# Patient Record
Sex: Male | Born: 1970
Health system: Southern US, Community
[De-identification: ages and names within clinical notes are randomized; demographics above are authoritative.]

## PROBLEM LIST (undated history)

## (undated) DIAGNOSIS — F32A Depression, unspecified: Secondary | ICD-10-CM

## (undated) DIAGNOSIS — F1010 Alcohol abuse, uncomplicated: Secondary | ICD-10-CM

## (undated) DIAGNOSIS — B019 Varicella without complication: Secondary | ICD-10-CM

## (undated) DIAGNOSIS — F329 Major depressive disorder, single episode, unspecified: Secondary | ICD-10-CM

## (undated) DIAGNOSIS — K219 Gastro-esophageal reflux disease without esophagitis: Secondary | ICD-10-CM

## (undated) HISTORY — DX: Depression, unspecified: F32.A

## (undated) HISTORY — DX: Gastro-esophageal reflux disease without esophagitis: K21.9

## (undated) HISTORY — DX: Major depressive disorder, single episode, unspecified: F32.9

## (undated) HISTORY — DX: Varicella without complication: B01.9

## (undated) HISTORY — PX: TONSILLECTOMY: SUR1361

## (undated) HISTORY — DX: Alcohol abuse, uncomplicated: F10.10

---

## 2013-06-19 DIAGNOSIS — T881XXA Other complications following immunization, not elsewhere classified, initial encounter: Secondary | ICD-10-CM | POA: Insufficient documentation

## 2013-06-20 DIAGNOSIS — F410 Panic disorder [episodic paroxysmal anxiety] without agoraphobia: Secondary | ICD-10-CM | POA: Insufficient documentation

## 2013-06-20 DIAGNOSIS — F329 Major depressive disorder, single episode, unspecified: Secondary | ICD-10-CM | POA: Insufficient documentation

## 2016-11-15 ENCOUNTER — Encounter (INDEPENDENT_AMBULATORY_CARE_PROVIDER_SITE_OTHER): Payer: Self-pay

## 2016-11-15 ENCOUNTER — Encounter: Payer: Self-pay | Admitting: Primary Care

## 2016-11-15 ENCOUNTER — Ambulatory Visit (INDEPENDENT_AMBULATORY_CARE_PROVIDER_SITE_OTHER): Payer: 59 | Admitting: Primary Care

## 2016-11-15 VITALS — BP 118/82 | HR 75 | Temp 98.3°F | Ht 71.25 in | Wt 179.8 lb

## 2016-11-15 DIAGNOSIS — K219 Gastro-esophageal reflux disease without esophagitis: Secondary | ICD-10-CM | POA: Insufficient documentation

## 2016-11-15 DIAGNOSIS — F419 Anxiety disorder, unspecified: Secondary | ICD-10-CM | POA: Diagnosis not present

## 2016-11-15 DIAGNOSIS — F329 Major depressive disorder, single episode, unspecified: Secondary | ICD-10-CM | POA: Insufficient documentation

## 2016-11-15 MED ORDER — FLUOXETINE HCL 40 MG PO CAPS: 40.0000 mg | ORAL_CAPSULE | Freq: Every day | ORAL | 2 refills | Status: DC

## 2016-11-15 NOTE — Patient Instructions (Signed)
I sent refills of the fluoxetine (Prozac) to your pharmacy.  Please sent me a copy of your recent labs.   It was a pleasure to meet you today! Please don't hesitate to call me with any questions. Welcome to [facility]!

## 2016-11-15 NOTE — Assessment & Plan Note (Signed)
Infrequent symptoms. Not on medication OTC or RX

## 2016-11-15 NOTE — Assessment & Plan Note (Signed)
Managed on Prozac, feels well managed. Refills sent to pharmacy. Denies SI/HI.

## 2016-11-15 NOTE — Progress Notes (Signed)
Subjective:    Patient ID: Bobby Diaz, male    DOB: Sep 24, 1970, 46 y.o.   MRN: [id number]  HPI  [name] is a 46 year old male who presents today to establish care and discuss the problems mentioned below. Will obtain old records. He follows annually for CPE's through his occupation.   1) PTSD/Anxiety/Depression: Diagnosed several years ago. Currently managed on fluoxetine 40 mg. Previously managed on Lexapro without improvement in symptoms. Feels well managed on Prozac. Denies SI/HI. Is needing refills today.  2) Hyperlipidemia: Noted on labs from [facility] Everywhere with TC of 227, LDL 160. Endorses normal lipid panel one week ago that was collected at [facility] through annual physical.     Review of Systems  Respiratory: Negative for shortness of breath.   Cardiovascular: Negative for chest pain.  Gastrointestinal:       Genella RifeGerd, infrequent symptoms  Skin: Negative for color change.  Allergic/Immunologic: Negative for environmental allergies.  Psychiatric/Behavioral: Negative for suicidal ideas.       Feels well managed on Prozac       Past Medical History:  Diagnosis Date  . Alcohol abuse   . Chickenpox   . Depression   . GERD (gastroesophageal reflux disease)      Social History   Social History  . Marital status: Married    Spouse name: N/A  . Number of children: N/A  . Years of education: N/A   Occupational History  . Not on file.   Social History Main Topics  . Smoking status: Current Every Day Smoker    Packs/day: 1.00  . Smokeless tobacco: Never Used  . Alcohol use No  . Drug use: Unknown  . Sexual activity: Not on file   Other Topics Concern  . Not on file   Social History Narrative   Married.   4 children.   Works in [employer].   Enjoys fishing, being outdoors, golfing.    No past surgical history on file.  Family History  Problem Relation Age of Onset  . Depression Mother   . Mental illness Mother   . Cancer Father   . Alcohol  abuse Brother   . Depression Brother   . Drug abuse Brother   . Mental illness Brother   . Cancer Maternal Grandmother        liver  . Depression Maternal Grandmother   . Alcohol abuse Maternal Grandfather   . Depression Maternal Grandfather   . Heart disease Maternal Grandfather   . Hyperlipidemia Maternal Grandfather   . Hypertension Maternal Grandfather   . Heart disease Paternal Grandmother   . Hyperlipidemia Paternal Grandmother   . Hyperlipidemia Paternal Grandfather     Allergies  Allergen Reactions  . Influenza Vaccines     Other reaction(s): Other Guillain-Barre  . Tetanus Toxoids Other (See Comments)    Can only have the synthetic shot    No current outpatient prescriptions on file prior to visit.   No current facility-administered medications on file prior to visit.     BP 118/82   Pulse 75   Temp 98.3 F (36.8 C) (Oral)   Ht 5' 11.25" (1.81 m)   Wt 179 lb 12.8 oz (81.6 kg)   SpO2 97%   BMI 24.90 kg/m    Objective:   Physical Exam  Constitutional: He is oriented to person, place, and time. He appears well-nourished.  Neck: Neck supple.  Cardiovascular: Normal rate and regular rhythm.   Pulmonary/Chest: Effort normal and breath  sounds normal. He has no wheezes. He has no rales.  Neurological: He is alert and oriented to person, place, and time.  Skin: Skin is warm and dry.  Psychiatric: He has a normal mood and affect.          Assessment & Plan:

## 2017-09-23 ENCOUNTER — Other Ambulatory Visit: Payer: Self-pay | Admitting: Primary Care

## 2017-09-23 DIAGNOSIS — F419 Anxiety disorder, unspecified: Principal | ICD-10-CM

## 2017-09-23 DIAGNOSIS — F329 Major depressive disorder, single episode, unspecified: Secondary | ICD-10-CM

## 2017-09-23 DIAGNOSIS — F32A Depression, unspecified: Secondary | ICD-10-CM

## 2017-09-25 ENCOUNTER — Emergency Department
Admission: EM | Admit: 2017-09-25 | Discharge: 2017-09-25 | Disposition: A | Discharge status: Home / Self Care | Payer: 59 | Attending: Emergency Medicine | Admitting: Emergency Medicine

## 2017-09-25 ENCOUNTER — Other Ambulatory Visit: Payer: Self-pay

## 2017-09-25 DIAGNOSIS — Y929 Unspecified place or not applicable: Secondary | ICD-10-CM | POA: Diagnosis not present

## 2017-09-25 DIAGNOSIS — S025XXA Fracture of tooth (traumatic), initial encounter for closed fracture: Secondary | ICD-10-CM

## 2017-09-25 DIAGNOSIS — X58XXXA Exposure to other specified factors, initial encounter: Secondary | ICD-10-CM | POA: Insufficient documentation

## 2017-09-25 DIAGNOSIS — Y939 Activity, unspecified: Secondary | ICD-10-CM | POA: Diagnosis not present

## 2017-09-25 DIAGNOSIS — K047 Periapical abscess without sinus: Secondary | ICD-10-CM | POA: Diagnosis not present

## 2017-09-25 DIAGNOSIS — Y999 Unspecified external cause status: Secondary | ICD-10-CM | POA: Diagnosis not present

## 2017-09-25 DIAGNOSIS — K0381 Cracked tooth: Secondary | ICD-10-CM | POA: Diagnosis not present

## 2017-09-25 DIAGNOSIS — F17210 Nicotine dependence, cigarettes, uncomplicated: Secondary | ICD-10-CM | POA: Diagnosis not present

## 2017-09-25 DIAGNOSIS — Z79899 Other long term (current) drug therapy: Secondary | ICD-10-CM | POA: Insufficient documentation

## 2017-09-25 MED ORDER — AMOXICILLIN 500 MG PO CAPS
500.0000 mg | ORAL_CAPSULE | Freq: Once | ORAL | Status: AC
  Administered 2017-09-25: 500 mg via ORAL
  Filled 2017-09-25: qty 1

## 2017-09-25 MED ORDER — IBUPROFEN 800 MG PO TABS
800.0000 mg | ORAL_TABLET | Freq: Once | ORAL | Status: AC
  Administered 2017-09-25: 800 mg via ORAL
  Filled 2017-09-25: qty 1

## 2017-09-25 MED ORDER — IBUPROFEN 800 MG PO TABS: 800.0000 mg | ORAL_TABLET | Freq: Three times a day (TID) | ORAL | 0 refills | Status: AC | PRN

## 2017-09-25 MED ORDER — OXYCODONE-ACETAMINOPHEN 5-325 MG PO TABS
1.0000 | ORAL_TABLET | Freq: Once | ORAL | Status: AC
  Administered 2017-09-25: 1 via ORAL
  Filled 2017-09-25: qty 1

## 2017-09-25 MED ORDER — AMOXICILLIN 500 MG PO CAPS: 500.0000 mg | ORAL_CAPSULE | Freq: Three times a day (TID) | ORAL | 0 refills | Status: DC

## 2017-09-25 MED ORDER — OXYCODONE-ACETAMINOPHEN 5-325 MG PO TABS: 1.0000 | ORAL_TABLET | ORAL | 0 refills | Status: DC | PRN

## 2017-09-25 MED ORDER — LIDOCAINE VISCOUS HCL 2 % MT SOLN
15.0000 mL | Freq: Once | OROMUCOSAL | Status: AC
  Administered 2017-09-25: 15 mL via OROMUCOSAL
  Filled 2017-09-25: qty 15

## 2017-09-25 NOTE — Discharge Instructions (Addendum)
1. Take antibiotic as prescribed (Amoxicillin 500mg three times daily x 7 days). °2. Take pain medicines as needed (Motrin/Percocet #15). °3. Return to the ER for worsening symptoms, persistent vomiting, fever, difficulty breathing or other concerns. ° °

## 2017-09-25 NOTE — ED Provider Notes (Signed)
[facility] Emergency Department Provider Note   ____________________________________________   First MD Initiated Contact with Patient 09/25/17 [phone]     (approximate)  I have reviewed the triage vital signs and the nursing notes.   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  Patient recently moved from [place] and does not yet have a dentist in the area.  States he broke his left lower tooth several days ago.  Now complains of pain and swelling.  Denies associated fever, chills, chest pain, shortness of breath, abdominal pain, nausea or vomiting.  Denies recent trauma.   Past Medical History:  Diagnosis Date  . Alcohol abuse   . Chickenpox   . Depression   . GERD (gastroesophageal reflux disease)     Patient Active Problem List   Diagnosis Date Noted  . Anxiety and depression 11/15/2016  . GERD (gastroesophageal reflux disease) 11/15/2016      Prior to Admission medications   Medication Sig Start Date End Date Taking? Authorizing Provider  amoxicillin (AMOXIL) 500 MG capsule Take 1 capsule (500 mg total) by mouth 3 (three) times daily. 09/25/17   Irean Hong, MD  FLUoxetine (PROZAC) 40 MG capsule Take 1 capsule (40 mg total) by mouth daily. NEED APPOINTMENT FOR ANY MORE REFILLS 09/23/17   Doreene Nest, NP  ibuprofen (ADVIL,MOTRIN) 800 MG tablet Take 1 tablet (800 mg total) by mouth every 8 (eight) hours as needed for moderate pain. 09/25/17   Irean Hong, MD  oxyCODONE-acetaminophen (PERCOCET/ROXICET) 5-325 MG tablet Take 1 tablet by mouth every 4 (four) hours as needed for severe pain. 09/25/17   Irean Hong, MD    Allergies Influenza vaccines and Tetanus toxoids  Family History  Problem Relation Age of Onset  . Depression Mother   . Mental illness Mother   . Cancer Father   . Alcohol abuse Brother   .  Depression Brother   . Drug abuse Brother   . Mental illness Brother   . Cancer Maternal Grandmother        liver  . Depression Maternal Grandmother   . Alcohol abuse Maternal Grandfather   . Depression Maternal Grandfather   . Heart disease Maternal Grandfather   . Hyperlipidemia Maternal Grandfather   . Hypertension Maternal Grandfather   . Heart disease Paternal Grandmother   . Hyperlipidemia Paternal Grandmother   . Hyperlipidemia Paternal Grandfather     Social History Social History   Tobacco Use  . Smoking status: Current Every Day Smoker    Packs/day: 1.00  . Smokeless tobacco: Never Used  Substance Use Topics  . Alcohol use: No  . Drug use: Not on file    Review of Systems  Constitutional: No fever/chills Eyes: No visual changes. ENT: Positive for dentalgia.  No sore throat. Cardiovascular: Denies chest pain. Respiratory: Denies shortness of breath. Gastrointestinal: No abdominal pain.  No nausea, no vomiting.  No diarrhea.  No constipation. Genitourinary: Negative for dysuria. Musculoskeletal: Negative for back pain. Skin: Negative for rash. Neurological: Negative for headaches, focal weakness or numbness.   ____________________________________________   PHYSICAL EXAM:  VITAL SIGNS: ED Triage Vitals  Enc Vitals Group     BP 09/25/17 0104 (!) 161/88     Pulse Rate 09/25/17 0104 68     Resp 09/25/17 0104 20     Temp  09/25/17 0104 97.6 F (36.4 C)     Temp Source 09/25/17 0104 Oral     SpO2 09/25/17 0104 99 %     Weight 09/25/17 0105 185 lb (83.9 kg)     Height 09/25/17 0105 6' (1.829 m)     Head Circumference --      Peak Flow --      Pain Score 09/25/17 0104 8     Pain Loc --      Pain Edu? --      Excl. in GC? --     Constitutional: Alert and oriented. Well appearing and in no acute distress. Eyes: Conjunctivae are normal. PERRL. EOMI. Head: Atraumatic. Nose: No congestion/rhinnorhea. Mouth/Throat: Mucous membranes are moist.  Left  lower molar cracked.  No intra-or extraoral swelling.  Tender to palpation with tongue blade. Neck: No stridor.  No neck swelling. Hematological/Lymphatic/Immunilogical: No cervical lymphadenopathy. Cardiovascular: Normal rate, regular rhythm. Grossly normal heart sounds.  Good peripheral circulation. Respiratory: Normal respiratory effort.  No retractions. Lungs CTAB. Gastrointestinal: Soft and nontender. No distention. No abdominal bruits. No CVA tenderness. Musculoskeletal: No lower extremity tenderness nor edema.  No joint effusions. Neurologic:  Normal speech and language. No gross focal neurologic deficits are appreciated. No gait instability. Skin:  Skin is warm, dry and intact. No rash noted. Psychiatric: Mood and affect are normal. Speech and behavior are normal.  ____________________________________________   LABS (all labs ordered are listed, but only abnormal results are displayed)  Labs Reviewed - No data to display ____________________________________________  EKG  None ____________________________________________  RADIOLOGY  ED MD interpretation: None  Official radiology report(s): No results found.  ____________________________________________   PROCEDURES  Procedure(s) performed: None  Procedures  Critical Care performed: No  ____________________________________________   INITIAL IMPRESSION / ASSESSMENT AND PLAN / ED COURSE  As part of my medical decision making, I reviewed the following data within the electronic MEDICAL RECORD NUMBER Nursing notes reviewed and incorporated, Old chart reviewed and Notes from prior ED visits   47 year old male who presents with dentalgia secondary to recent broken tooth.  Will start patient on amoxicillin, Motrin and Percocet for pain, and refer to local dentist.  Lidocaine rinse in the ED.  Strict return precautions given.  Patient verbalizes understanding and agrees with plan of care.       ____________________________________________   FINAL CLINICAL IMPRESSION(S) / ED DIAGNOSES  Final diagnoses:  Closed fracture of tooth, initial encounter  Dental infection     ED Discharge Orders        Ordered    amoxicillin (AMOXIL) 500 MG capsule  3 times daily     09/25/17 0250    ibuprofen (ADVIL,MOTRIN) 800 MG tablet  Every 8 hours PRN     09/25/17 0250    oxyCODONE-acetaminophen (PERCOCET/ROXICET) 5-325 MG tablet  Every 4 hours PRN     09/25/17 0250       Note:  This document was prepared using Dragon voice recognition software and may include unintentional dictation errors.    Irean HongSung, Jade J, MD 09/25/17 [id number]

## 2017-09-25 NOTE — ED Triage Notes (Signed)
Reports broke a tooth recently.  Reports pain to left lower jaw.

## 2017-09-25 NOTE — ED Notes (Signed)
Pt to the er for a infection in the mouth. Pt broke tooth on the lower left side. Now pain and swelling present. Pt recently moved from [place] and does not have a Education officer, communitydentist.

## 2017-10-22 ENCOUNTER — Other Ambulatory Visit: Payer: Self-pay | Admitting: Primary Care

## 2017-10-22 DIAGNOSIS — F329 Major depressive disorder, single episode, unspecified: Secondary | ICD-10-CM

## 2017-10-22 DIAGNOSIS — F419 Anxiety disorder, unspecified: Principal | ICD-10-CM

## 2017-10-22 DIAGNOSIS — F32A Depression, unspecified: Secondary | ICD-10-CM

## 2017-12-09 ENCOUNTER — Other Ambulatory Visit: Payer: Self-pay | Admitting: Primary Care

## 2017-12-09 DIAGNOSIS — F329 Major depressive disorder, single episode, unspecified: Secondary | ICD-10-CM

## 2017-12-09 DIAGNOSIS — F419 Anxiety disorder, unspecified: Principal | ICD-10-CM

## 2017-12-13 ENCOUNTER — Telehealth: Payer: Self-pay | Admitting: Primary Care

## 2017-12-13 DIAGNOSIS — F329 Major depressive disorder, single episode, unspecified: Secondary | ICD-10-CM

## 2017-12-13 DIAGNOSIS — F419 Anxiety disorder, unspecified: Principal | ICD-10-CM

## 2017-12-13 MED ORDER — FLUOXETINE HCL 40 MG PO CAPS: 40.0000 mg | ORAL_CAPSULE | Freq: Every day | ORAL | 0 refills | Status: DC

## 2017-12-13 NOTE — Telephone Encounter (Signed)
Fluoxetine refill Last Refill:10/24/17 # 30 Last OV: 11/15/16 PCP: Mayra Reel NP [facility] [address] Pt has upcoming appt 12/19/17. Will refill until appt.

## 2017-12-13 NOTE — Telephone Encounter (Signed)
Copied from CRM [id number]. Topic: Quick Communication - Rx Refill/Question >> Dec 13, 2017 10:47 AM Mcneil, Ja-Kwan wrote: Medication: FLUoxetine (PROZAC) 40 MG capsule  Has the patient contacted their pharmacy? no  Preferred Pharmacy (with phone number or street name): [id number] [place], [place] - [address] [phone] (Phone) [phone] (Fax)  Agent: Please be advised that RX refills may take up to 3 business days. We ask that you follow-up with your pharmacy.

## 2017-12-19 ENCOUNTER — Ambulatory Visit: Payer: 59 | Admitting: Primary Care

## 2017-12-29 ENCOUNTER — Ambulatory Visit (INDEPENDENT_AMBULATORY_CARE_PROVIDER_SITE_OTHER): Payer: 59 | Admitting: Primary Care

## 2017-12-29 ENCOUNTER — Encounter: Payer: Self-pay | Admitting: Primary Care

## 2017-12-29 DIAGNOSIS — F419 Anxiety disorder, unspecified: Secondary | ICD-10-CM | POA: Diagnosis not present

## 2017-12-29 DIAGNOSIS — F329 Major depressive disorder, single episode, unspecified: Secondary | ICD-10-CM

## 2017-12-29 MED ORDER — FLUOXETINE HCL 40 MG PO CAPS: 40.0000 mg | ORAL_CAPSULE | Freq: Every day | ORAL | 3 refills | Status: DC

## 2017-12-29 MED ORDER — FLUOXETINE HCL 20 MG PO CAPS: 20.0000 mg | ORAL_CAPSULE | Freq: Every day | ORAL | 0 refills | Status: DC

## 2017-12-29 NOTE — Progress Notes (Signed)
Subjective:    Patient ID: Bobby Diaz, male    DOB: 13-Jan-1971, 47 y.o.   MRN: [id number]  HPI  Mr. Bobby Diaz is a 47 year old male who presents today for medication refill.   He is currently managed on fluoxetine 40 mg for anxiety and depression. He was last evaluated one year ago as a new patient, get's his annual CPE's from his employer.   He is due for a refill today. 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 His wife has noticed a decrease in his overall patience over the last 6-8 months. He has noticed that he's more irritable, he will "shut down", feels anxious, worries. She has been under a lot of stress with home and work life. Has decreased libido over the last 6 months, has been under a lot of stress at home.  GAD 7 score of 10 today. He was once on Lexapro in the past which made him feel "woozy". He feels that the fluoxetine has been helpful overall. He has tried therapy in the past, isn't sure about therapy as he wants to talk to his wife.      Review of Systems  Respiratory: Negative for shortness of breath.   Cardiovascular: Negative for chest pain.  Genitourinary:       Decreased libido  Psychiatric/Behavioral:       See HPI       Past Medical History:  Diagnosis Date  . Alcohol abuse   . Chickenpox   . Depression   . GERD (gastroesophageal reflux disease)      Social History   Socioeconomic History  . Marital status: Married    Spouse name: Not on file  . Number of children: Not on file  . Years of education: Not on file  . Highest education level: Not on file  Occupational History  . Not on file  Social Needs  . Financial resource strain: Not on file  . Food insecurity:    Worry: Not on file    Inability: Not on file  . Transportation needs:    Medical: Not on file    Non-medical: Not on file  Tobacco Use  . Smoking status: Current Every Day Smoker    Packs/day: 1.00  . Smokeless tobacco: Never  Used  Substance and Sexual Activity  . Alcohol use: No  . Drug use: Not on file  . Sexual activity: Not on file  Lifestyle  . Physical activity:    Days per week: Not on file    Minutes per session: Not on file  . Stress: Not on file  Relationships  . Social connections:    Talks on phone: Not on file    Gets together: Not on file    Attends religious service: Not on file    Active member of club or organization: Not on file    Attends meetings of clubs or organizations: Not on file    Relationship status: Not on file  . Intimate partner violence:    Fear of current or ex partner: Not on file    Emotionally abused: Not on file    Physically abused: Not on file    Forced sexual activity: Not on file  Other Topics Concern  . Not on file  Social History Narrative   Married.   4 children.   Works in [employer].   Enjoys fishing, being outdoors, golfing.    Family History  Problem Relation  Age of Onset  . Depression Mother   . Mental illness Mother   . Cancer Father   . Alcohol abuse Brother   . Depression Brother   . Drug abuse Brother   . Mental illness Brother   . Cancer Maternal Grandmother        liver  . Depression Maternal Grandmother   . Alcohol abuse Maternal Grandfather   . Depression Maternal Grandfather   . Heart disease Maternal Grandfather   . Hyperlipidemia Maternal Grandfather   . Hypertension Maternal Grandfather   . Heart disease Paternal Grandmother   . Hyperlipidemia Paternal Grandmother   . Hyperlipidemia Paternal Grandfather     Allergies  Allergen Reactions  . Influenza Vaccines     Other reaction(s): Other Guillain-Barre  . Tetanus Toxoids Other (See Comments)    Can only have the synthetic shot    Current Outpatient Medications on File Prior to Visit  Medication Sig Dispense Refill  . ibuprofen (ADVIL,MOTRIN) 800 MG tablet Take 1 tablet (800 mg total) by mouth every 8 (eight) hours as needed for moderate pain. 15 tablet 0    No current facility-administered medications on file prior to visit.     BP 140/84   Pulse 64   Temp 98 F (36.7 C) (Oral)   Ht 6' (1.829 m)   Wt 191 lb (86.6 kg)   SpO2 98%   BMI 25.90 kg/m    Objective:   Physical Exam  Constitutional: He appears well-nourished.  Neck: Neck supple.  Cardiovascular: Normal rate and regular rhythm.  Respiratory: Effort normal and breath sounds normal.  Skin: Skin is warm and dry.  Psychiatric: He has a normal mood and affect.           Assessment & Plan:

## 2017-12-29 NOTE — Patient Instructions (Signed)
We've increased the dose of your fluoxetine to 60 mg. There is no 60 mg tablet so take one 40 mg capsule and one 20 mg capsule to equal 60 mg.  Please update me in one month.   It was a pleasure to see you today!

## 2017-12-29 NOTE — Assessment & Plan Note (Signed)
Deteriorated, likely due to added work and home stressors. Fluoxetine is effective overall so we will increase his dose to 60 mg. Recommended therapy, he kindly declines and will discuss with his wife.  He will update in 4 weeks.

## 2018-02-13 ENCOUNTER — Telehealth: Payer: Self-pay

## 2018-02-13 ENCOUNTER — Telehealth: Payer: Self-pay | Admitting: Primary Care

## 2018-02-13 DIAGNOSIS — N522 Drug-induced erectile dysfunction: Secondary | ICD-10-CM

## 2018-02-13 DIAGNOSIS — N529 Male erectile dysfunction, unspecified: Secondary | ICD-10-CM | POA: Insufficient documentation

## 2018-02-13 MED ORDER — SILDENAFIL CITRATE 20 MG PO TABS: ORAL_TABLET | ORAL | 0 refills | Status: DC

## 2018-02-13 NOTE — Telephone Encounter (Signed)
Spoken and notified patient of Kate Clark's comments. Patient verbalized understanding.  

## 2018-02-13 NOTE — Telephone Encounter (Signed)
If his insurance doesn't cover generic Viagra, which is sildenafil, then I doubt they will cover Viagra. He can call his insurance to inquire, or he can switch pharmacies to [facility] which is historically cheaper. He can also pay for less pills at a time, have the pharmacy just fill for 10 or so. This may be more affordable.

## 2018-02-13 NOTE — Telephone Encounter (Signed)
Noted. Please notify patient that I sent in a prescription for sildenafil 20 mg tablets to his pharmacy. Take 2-5 tablets by mouth 30 minutes prior to intercourse. Please have him update with any problems. Start with 2 tablets initially.

## 2018-02-13 NOTE — Telephone Encounter (Signed)
Best number [phone] Pt called stating pharmacy just let him sil rx is not covered by his insurance what does he need to do  [employer]

## 2018-02-13 NOTE — Telephone Encounter (Signed)
Pt came by office; pt last seen 12/29/17; pt increased Fluoxetine to 60 mg with improvement in anxiety; no real issue with anxiety now. After 1 week of taking Fluoxetine 60 mg the ED worsened. Pt said he and Allayne Gitelman NP had already discussed possibility of ED worsening and to call in for medication. [facility]. Pt request cb after Allayne Gitelman NP reviews note.

## 2018-02-13 NOTE — Assessment & Plan Note (Signed)
History of before with antidepressants, recent bout with ED after increasing fluoxetine to 60 mg. Rx for sildenafil 20 mg tablets sent to pharmacy. He will update.

## 2018-02-14 NOTE — Telephone Encounter (Signed)
Per DPR, left detail message of Bobby Diaz's comments for patient. 

## 2018-03-23 ENCOUNTER — Other Ambulatory Visit: Payer: Self-pay | Admitting: Primary Care

## 2018-03-23 DIAGNOSIS — F419 Anxiety disorder, unspecified: Principal | ICD-10-CM

## 2018-03-23 DIAGNOSIS — F32A Depression, unspecified: Secondary | ICD-10-CM

## 2018-03-23 DIAGNOSIS — F329 Major depressive disorder, single episode, unspecified: Secondary | ICD-10-CM

## 2018-03-23 NOTE — Telephone Encounter (Signed)
Noted, refill sent to pharmacy. 

## 2018-04-16 ENCOUNTER — Other Ambulatory Visit: Payer: Self-pay | Admitting: Primary Care

## 2018-04-16 DIAGNOSIS — F329 Major depressive disorder, single episode, unspecified: Secondary | ICD-10-CM

## 2018-04-16 DIAGNOSIS — F419 Anxiety disorder, unspecified: Principal | ICD-10-CM

## 2018-04-16 DIAGNOSIS — F32A Depression, unspecified: Secondary | ICD-10-CM

## 2018-05-05 DIAGNOSIS — J09X2 Influenza due to identified novel influenza A virus with other respiratory manifestations: Secondary | ICD-10-CM | POA: Diagnosis not present

## 2018-05-05 DIAGNOSIS — J111 Influenza due to unidentified influenza virus with other respiratory manifestations: Secondary | ICD-10-CM | POA: Diagnosis not present

## 2019-02-07 ENCOUNTER — Other Ambulatory Visit: Payer: Self-pay | Admitting: *Deleted

## 2019-02-07 DIAGNOSIS — Z20822 Contact with and (suspected) exposure to covid-19: Secondary | ICD-10-CM

## 2019-02-08 LAB — NOVEL CORONAVIRUS, NAA: SARS-CoV-2, NAA: NOT DETECTED

## 2019-02-19 ENCOUNTER — Other Ambulatory Visit: Payer: Self-pay | Admitting: Primary Care

## 2019-02-19 DIAGNOSIS — F419 Anxiety disorder, unspecified: Secondary | ICD-10-CM

## 2019-02-19 DIAGNOSIS — F329 Major depressive disorder, single episode, unspecified: Secondary | ICD-10-CM

## 2019-02-20 NOTE — Telephone Encounter (Signed)
Patient needs a follow up appointment scheduled please.

## 2019-02-21 NOTE — Telephone Encounter (Signed)
Patient returned call and scheduled medication follow up

## 2019-02-21 NOTE — Telephone Encounter (Signed)
Lvm asking pt to call office 

## 2019-02-21 NOTE — Telephone Encounter (Signed)
Patient called back and confirmed that he is taking 60 mg (3 capsule of 20 mg)

## 2019-02-21 NOTE — Telephone Encounter (Signed)
Left detail message asking patient to comfirm if he taking 40 mg or taking 60 mg (3 capsule of 20 mg)

## 2019-02-23 ENCOUNTER — Ambulatory Visit (INDEPENDENT_AMBULATORY_CARE_PROVIDER_SITE_OTHER): Payer: 59 | Admitting: Primary Care

## 2019-02-23 ENCOUNTER — Other Ambulatory Visit: Payer: Self-pay

## 2019-02-23 ENCOUNTER — Encounter: Payer: Self-pay | Admitting: Primary Care

## 2019-02-23 VITALS — BP 134/86 | HR 80 | Temp 98.0°F | Ht 72.0 in | Wt 196.0 lb

## 2019-02-23 DIAGNOSIS — N522 Drug-induced erectile dysfunction: Secondary | ICD-10-CM | POA: Diagnosis not present

## 2019-02-23 DIAGNOSIS — F419 Anxiety disorder, unspecified: Secondary | ICD-10-CM

## 2019-02-23 DIAGNOSIS — Z1211 Encounter for screening for malignant neoplasm of colon: Secondary | ICD-10-CM | POA: Diagnosis not present

## 2019-02-23 DIAGNOSIS — G4726 Circadian rhythm sleep disorder, shift work type: Secondary | ICD-10-CM

## 2019-02-23 DIAGNOSIS — F329 Major depressive disorder, single episode, unspecified: Secondary | ICD-10-CM

## 2019-02-23 MED ORDER — FLUOXETINE HCL 20 MG PO CAPS: 60.0000 mg | ORAL_CAPSULE | Freq: Every day | ORAL | 3 refills | Status: DC

## 2019-02-23 MED ORDER — SILDENAFIL CITRATE 20 MG PO TABS: ORAL_TABLET | ORAL | 0 refills | Status: DC

## 2019-02-23 NOTE — Patient Instructions (Signed)
Try Melatonin for shift sleep disorder. Take about one hour prior to bedtime. Max dose is 10 mg in 24 hours. You could try Unisom if Melatonin isn't effective.  You will be contacted regarding your referral to GI for the colonoscopy.  Please let us know if you have not been contacted within two weeks.   It was a pleasure to see you today!

## 2019-02-23 NOTE — Assessment & Plan Note (Signed)
Alternating between day and night shift at work, difficulty getting restful sleep. Discussed use of OTC Melatonin or Unisom. He will update. Consider Trazodone if needed.

## 2019-02-23 NOTE — Assessment & Plan Note (Signed)
Intermittent, never took sildenafil. Refills provided. He will update.

## 2019-02-23 NOTE — Progress Notes (Signed)
Subjective:    Patient ID: Bobby Diaz, male    DOB: August 02, 1970, 49 y.o.   MRN: [id number]  HPI  Bobby Diaz is a 48 year old male who presents today for follow up. He completes annual physicals through work, including blood work. His PSA is UTD per patient. He has not been referred for colon cancer screening.   1) Anxiety and Depression: Currently managed on fluoxetine 60 mg. Overall feels well managed on fluoxetine 60 mg. He denies SI/HI. He is needing refills.  2) Erectile Dysfunction: Chronic. Currently prescribed on sildenafil 20 mg PRN but he never filled the prescription due to price. He continues to experience erectile dysfunction at times, would like a refill.  BP Readings from Last 3 Encounters:  02/23/19 134/86  12/29/17 140/84  09/25/17 (!) 161/88   3) Sleep Disturbance: Works in [employer] and is alternating between day and night shifts. This has caused difficulty getting good quality sleep, chronic over the last one year. He has not tried anything OTC.  Review of Systems  Respiratory: Negative for shortness of breath.   Cardiovascular: Negative for chest pain.  Genitourinary:       Erectile dysfunction   Neurological: Negative for dizziness.  Psychiatric/Behavioral: Positive for sleep disturbance. Negative for suicidal ideas.       Past Medical History:  Diagnosis Date  . Alcohol abuse   . Chickenpox   . Depression   . GERD (gastroesophageal reflux disease)      Social History   Socioeconomic History  . Marital status: Married    Spouse name: Not on file  . Number of children: Not on file  . Years of education: Not on file  . Highest education level: Not on file  Occupational History  . Not on file  Social Needs  . Financial resource strain: Not on file  . Food insecurity    Worry: Not on file    Inability: Not on file  . Transportation needs    Medical: Not on file    Non-medical: Not on file  Tobacco Use  . Smoking status: Current  Every Day Smoker    Packs/day: 1.00  . Smokeless tobacco: Never Used  Substance and Sexual Activity  . Alcohol use: No  . Drug use: Not on file  . Sexual activity: Not on file  Lifestyle  . Physical activity    Days per week: Not on file    Minutes per session: Not on file  . Stress: Not on file  Relationships  . Social Herbalist on phone: Not on file    Gets together: Not on file    Attends religious service: Not on file    Active member of club or organization: Not on file    Attends meetings of clubs or organizations: Not on file    Relationship status: Not on file  . Intimate partner violence    Fear of current or ex partner: Not on file    Emotionally abused: Not on file    Physically abused: Not on file    Forced sexual activity: Not on file  Other Topics Concern  . Not on file  Social History Narrative   Married.   4 children.   Works in [employer].   Enjoys fishing, being outdoors, golfing.    Family History  Problem Relation Age of Onset  . Depression Mother   . Mental illness Mother   . Cancer Father   .  Alcohol abuse Brother   . Depression Brother   . Drug abuse Brother   . Mental illness Brother   . Cancer Maternal Grandmother        liver  . Depression Maternal Grandmother   . Alcohol abuse Maternal Grandfather   . Depression Maternal Grandfather   . Heart disease Maternal Grandfather   . Hyperlipidemia Maternal Grandfather   . Hypertension Maternal Grandfather   . Heart disease Paternal Grandmother   . Hyperlipidemia Paternal Grandmother   . Hyperlipidemia Paternal Grandfather     Allergies  Allergen Reactions  . Influenza Vaccines     Other reaction(s): Other Guillain-Barre  . Tetanus Toxoids Other (See Comments)    Can only have the synthetic shot    Current Outpatient Medications on File Prior to Visit  Medication Sig Dispense Refill  . ibuprofen (ADVIL,MOTRIN) 800 MG tablet Take 1 tablet (800 mg total) by mouth every  8 (eight) hours as needed for moderate pain. 15 tablet 0   No current facility-administered medications on file prior to visit.     BP 134/86   Pulse 80   Temp 98 F (36.7 C) (Temporal)   Ht 6' (1.829 m)   Wt 196 lb (88.9 kg)   SpO2 97%   BMI 26.58 kg/m    Objective:   Physical Exam  Constitutional: He appears well-nourished.  Neck: Neck supple.  Cardiovascular: Normal rate and regular rhythm.  Respiratory: Effort normal and breath sounds normal.  Skin: Skin is warm and dry.  Psychiatric: He has a normal mood and affect.           Assessment & Plan:

## 2019-02-23 NOTE — Assessment & Plan Note (Signed)
Overall doing well on fluoxetine 60 mg, denies SI/HI. Continue same.

## 2019-03-02 ENCOUNTER — Encounter: Payer: Self-pay | Admitting: *Deleted

## 2019-06-22 ENCOUNTER — Telehealth: Payer: Self-pay

## 2019-06-22 NOTE — Telephone Encounter (Signed)
Referral closed.  See Referral Notes/hx.  Pt has not responded to phone calls or Unable to Contact letter.  

## 2019-06-23 NOTE — Telephone Encounter (Signed)
Noted  

## 2019-12-12 ENCOUNTER — Emergency Department: Payer: 59

## 2019-12-12 ENCOUNTER — Other Ambulatory Visit: Payer: Self-pay

## 2019-12-12 ENCOUNTER — Emergency Department
Admission: EM | Admit: 2019-12-12 | Discharge: 2019-12-12 | Disposition: A | Discharge status: Home / Self Care | Payer: 59 | Attending: Emergency Medicine | Admitting: Emergency Medicine

## 2019-12-12 ENCOUNTER — Encounter: Payer: Self-pay | Admitting: Emergency Medicine

## 2019-12-12 DIAGNOSIS — S93601A Unspecified sprain of right foot, initial encounter: Secondary | ICD-10-CM | POA: Diagnosis not present

## 2019-12-12 DIAGNOSIS — Z79899 Other long term (current) drug therapy: Secondary | ICD-10-CM | POA: Diagnosis not present

## 2019-12-12 DIAGNOSIS — Y999 Unspecified external cause status: Secondary | ICD-10-CM | POA: Insufficient documentation

## 2019-12-12 DIAGNOSIS — W11XXXA Fall on and from ladder, initial encounter: Secondary | ICD-10-CM | POA: Diagnosis not present

## 2019-12-12 DIAGNOSIS — Y92015 Private garage of single-family (private) house as the place of occurrence of the external cause: Secondary | ICD-10-CM | POA: Diagnosis not present

## 2019-12-12 DIAGNOSIS — S93401A Sprain of unspecified ligament of right ankle, initial encounter: Secondary | ICD-10-CM | POA: Insufficient documentation

## 2019-12-12 DIAGNOSIS — Y92009 Unspecified place in unspecified non-institutional (private) residence as the place of occurrence of the external cause: Secondary | ICD-10-CM

## 2019-12-12 DIAGNOSIS — Y9389 Activity, other specified: Secondary | ICD-10-CM | POA: Insufficient documentation

## 2019-12-12 DIAGNOSIS — S99911A Unspecified injury of right ankle, initial encounter: Secondary | ICD-10-CM | POA: Diagnosis present

## 2019-12-12 DIAGNOSIS — F17200 Nicotine dependence, unspecified, uncomplicated: Secondary | ICD-10-CM | POA: Diagnosis not present

## 2019-12-12 DIAGNOSIS — S92012A Displaced fracture of body of left calcaneus, initial encounter for closed fracture: Secondary | ICD-10-CM

## 2019-12-12 NOTE — ED Triage Notes (Signed)
Pt reports fell while working on a garage door and landed on his right ankle and thinks he shattered it.

## 2019-12-12 NOTE — Discharge Instructions (Addendum)
You are being treated for heel fracture. You will be placed in a bulky splint and will use crutches to ambulate without bearing weight on the foot. Rest with the foot elevated and apply ice packs over the splint. Take OTC Tylenol and follow-up with Dr. Excell Seltzer or [facility] for further fracture management. Return if needed.

## 2019-12-12 NOTE — ED Provider Notes (Signed)
[facility] Emergency Department Provider Note ____________________________________________  Time seen: 1057  I have reviewed the triage vital signs and the nursing notes.  HISTORY  Chief Complaint  Fall and Ankle Pain  HPI Bobby Diaz is a 49 y.o. male presents to the ED for evaluation of right foot ankle pain.  Patient describes a fall while working on his garage door.  He landed on his feet after jumping from about 6 feet high on the ladder.  He has had significant pain, swelling, and disability to the foot/ankle since that time.  He denies any other injury at this time.   Past Medical History:  Diagnosis Date  . Alcohol abuse   . Chickenpox   . Depression   . GERD (gastroesophageal reflux disease)     Patient Active Problem List   Diagnosis Date Noted  . Shifting sleep-work schedule, affecting sleep 02/23/2019  . Erectile dysfunction 02/13/2018  . Anxiety and depression 11/15/2016  . GERD (gastroesophageal reflux disease) 11/15/2016    History reviewed. No pertinent surgical history.  Prior to Admission medications   Medication Sig Start Date End Date Taking? Authorizing Provider  FLUoxetine (PROZAC) 20 MG capsule Take 3 capsules (60 mg total) by mouth daily. For anxiety. 02/23/19   Doreene Nest, NP  ibuprofen (ADVIL,MOTRIN) 800 MG tablet Take 1 tablet (800 mg total) by mouth every 8 (eight) hours as needed for moderate pain. 09/25/17   Irean Hong, MD  sildenafil (REVATIO) 20 MG tablet Take 2-5 tablets 30 minutes prior to intercourse. 02/23/19   Doreene Nest, NP    Allergies Influenza vaccines and Tetanus toxoids  Family History  Problem Relation Age of Onset  . Depression Mother   . Mental illness Mother   . Cancer Father   . Alcohol abuse Brother   . Depression Brother   . Drug abuse Brother   . Mental illness Brother   . Cancer Maternal Grandmother        liver  . Depression Maternal Grandmother   . Alcohol abuse  Maternal Grandfather   . Depression Maternal Grandfather   . Heart disease Maternal Grandfather   . Hyperlipidemia Maternal Grandfather   . Hypertension Maternal Grandfather   . Heart disease Paternal Grandmother   . Hyperlipidemia Paternal Grandmother   . Hyperlipidemia Paternal Grandfather     Social History Social History   Tobacco Use  . Smoking status: Current Every Day Smoker    Packs/day: 1.00  . Smokeless tobacco: Never Used  Substance Use Topics  . Alcohol use: No  . Drug use: Not on file    Review of Systems  Constitutional: Negative for fever. Eyes: Negative for visual changes. ENT: Negative for sore throat. Cardiovascular: Negative for chest pain. Respiratory: Negative for shortness of breath. Musculoskeletal: Negative for back pain. Right foot/ankle pain Skin: Negative for rash. Neurological: Negative for headaches, focal weakness or numbness. ____________________________________________  PHYSICAL EXAM:  VITAL SIGNS: ED Triage Vitals  Enc Vitals Group     BP 12/12/19 0948 126/82     Pulse Rate 12/12/19 0948 87     Resp 12/12/19 0948 20     Temp 12/12/19 0948 98.1 F (36.7 C)     Temp Source 12/12/19 0948 Oral     SpO2 12/12/19 0948 97 %     Weight 12/12/19 0943 184 lb (83.5 kg)     Height 12/12/19 0943 6' (1.829 m)     Head Circumference --  Peak Flow --      Pain Score 12/12/19 0943 10     Pain Loc --      Pain Edu? --      Excl. in GC? --     Constitutional: Alert and oriented. Well appearing and in no distress. Head: Normocephalic and atraumatic. Eyes: Conjunctivae are normal. Normal extraocular movements Cardiovascular: Normal rate, regular rhythm. Normal distal pulses. Respiratory: Normal respiratory effort.  Musculoskeletal: Right foot without obvious deformity or dislocation.  Patient tender to palpation to the calcaneal heel and plantar heel surface.  Ankle ranges limited secondary to pain.  Nontender with normal range of motion in  all extremities.  Neurologic:  Normal gross sensation. Normal speech and language. No gross focal neurologic deficits are appreciated. Skin:  Skin is warm, dry and intact. No rash noted. ____________________________________________   RADIOLOGY  DG Right Ankle IMPRESSION: 1. No signs of medial or lateral malleolar fracture. 2. Irregularity over the lateral aspect of the ankle could represent degenerative change within talocalcaneal articulation. Possibility of a cuboid injury is considered based on the swelling in this location. Dedicated foot radiographs may be helpful for further assessment.  DG Right Foot  IMPRESSION: Mildly displaced fracture through the body of the right calcaneus. ____________________________________________  PROCEDURES  Jones-Watson wrap - right foot  Procedures ____________________________________________  INITIAL IMPRESSION / ASSESSMENT AND PLAN / ED COURSE  Patient with ED evaluation and initial management of a mildly displaced fracture of the calcaneus.  Patient reports a fall from a 6 foot high ladder onto his feet.  He reported immediate pain to the base of the right foot.  X-ray did reveal a mildly displaced fracture of the body of the calcaneus.  Patient is placed in a bulky Jones Watson splint.  He is also advised to use crutches to ambulate without weightbearing.  He has crutches at home which he will utilize.  He is discharged from here to his own care with instructions to follow-up with podiatry.  Patient declined any offer for narcotic pain medicine, instead reporting he would take Tylenol as needed.  Work note is provided over the patient out of work until he is cleared by the specialist.  Spoke briefly with Dr. Rosetta Posner, D.P.M. via message chat regarding the case.  He would like the patient to have CT scanning performed for further management of the injury.  Patient at the time of this message had been discharged from the ED, I advised Dr.  Excell Seltzer that the patient would likely need an outpatient order for imaging ahead of his follow-up visit.  Bobby Diaz was evaluated in Emergency Department on 12/12/2019 for the symptoms described in the history of present illness. He was evaluated in the context of the global COVID-19 pandemic, which necessitated consideration that the patient might be at risk for infection with the SARS-CoV-2 virus that causes COVID-19. Institutional protocols and algorithms that pertain to the evaluation of patients at risk for COVID-19 are in a state of rapid change based on information released by regulatory bodies including the CDC and federal and state organizations. These policies and algorithms were followed during the patient's care in the ED. ____________________________________________  FINAL CLINICAL IMPRESSION(S) / ED DIAGNOSES  Final diagnoses:  Sprain of right ankle, unspecified ligament, initial encounter  Foot sprain, right, initial encounter  Fall in home, initial encounter      Lissa Hoard, PA-C 12/12/19 1417    Minna Antis, MD 12/12/19 1429

## 2019-12-12 NOTE — ED Notes (Signed)
Patient refused crutches , patient state's he has some at home notified nurse and P.A. Jensie

## 2019-12-13 ENCOUNTER — Other Ambulatory Visit (HOSPITAL_COMMUNITY): Payer: Self-pay | Admitting: Podiatry

## 2019-12-13 ENCOUNTER — Other Ambulatory Visit: Payer: Self-pay | Admitting: Podiatry

## 2019-12-13 DIAGNOSIS — S92014A Nondisplaced fracture of body of right calcaneus, initial encounter for closed fracture: Secondary | ICD-10-CM

## 2019-12-18 ENCOUNTER — Ambulatory Visit: Payer: 59

## 2019-12-21 ENCOUNTER — Other Ambulatory Visit: Payer: Self-pay

## 2019-12-21 ENCOUNTER — Ambulatory Visit
Admission: RE | Admit: 2019-12-21 | Discharge: 2019-12-21 | Disposition: A | Discharge status: Home / Self Care | Payer: 59 | Source: Ambulatory Visit | Attending: Podiatry | Admitting: Podiatry

## 2019-12-21 DIAGNOSIS — S92014A Nondisplaced fracture of body of right calcaneus, initial encounter for closed fracture: Secondary | ICD-10-CM | POA: Insufficient documentation

## 2020-03-23 ENCOUNTER — Other Ambulatory Visit: Payer: Self-pay | Admitting: Primary Care

## 2020-03-23 DIAGNOSIS — F32A Depression, unspecified: Secondary | ICD-10-CM

## 2020-05-27 ENCOUNTER — Telehealth: Payer: Self-pay | Admitting: Primary Care

## 2020-05-27 NOTE — Telephone Encounter (Signed)
Left message to return call to our office. Has not been seen in over a year will need app for any referral.

## 2020-05-27 NOTE — Telephone Encounter (Signed)
Pt called in wanted to know about getting a referral for his sleep apnea. Bobby Diaz

## 2020-05-29 NOTE — Telephone Encounter (Signed)
Left message to return call to our office.  

## 2020-06-02 NOTE — Telephone Encounter (Signed)
Pt has made f/u for this week.

## 2020-06-03 ENCOUNTER — Encounter: Payer: Self-pay | Admitting: Primary Care

## 2020-06-03 ENCOUNTER — Ambulatory Visit (INDEPENDENT_AMBULATORY_CARE_PROVIDER_SITE_OTHER): Payer: 59 | Admitting: Primary Care

## 2020-06-03 ENCOUNTER — Other Ambulatory Visit: Payer: Self-pay

## 2020-06-03 VITALS — BP 132/84 | HR 75 | Temp 98.4°F | Ht 72.0 in | Wt 204.0 lb

## 2020-06-03 DIAGNOSIS — R0683 Snoring: Secondary | ICD-10-CM

## 2020-06-03 DIAGNOSIS — Z125 Encounter for screening for malignant neoplasm of prostate: Secondary | ICD-10-CM | POA: Diagnosis not present

## 2020-06-03 DIAGNOSIS — Z1159 Encounter for screening for other viral diseases: Secondary | ICD-10-CM

## 2020-06-03 DIAGNOSIS — E785 Hyperlipidemia, unspecified: Secondary | ICD-10-CM | POA: Insufficient documentation

## 2020-06-03 DIAGNOSIS — Z1211 Encounter for screening for malignant neoplasm of colon: Secondary | ICD-10-CM

## 2020-06-03 DIAGNOSIS — N522 Drug-induced erectile dysfunction: Secondary | ICD-10-CM

## 2020-06-03 DIAGNOSIS — Z114 Encounter for screening for human immunodeficiency virus [HIV]: Secondary | ICD-10-CM | POA: Diagnosis not present

## 2020-06-03 DIAGNOSIS — F419 Anxiety disorder, unspecified: Secondary | ICD-10-CM

## 2020-06-03 DIAGNOSIS — F32A Depression, unspecified: Secondary | ICD-10-CM

## 2020-06-03 DIAGNOSIS — Z8042 Family history of malignant neoplasm of prostate: Secondary | ICD-10-CM

## 2020-06-03 HISTORY — DX: Snoring: R06.83

## 2020-06-03 LAB — CBC
HCT: 42.6 % (ref 39.0–52.0)
Hemoglobin: 14.5 g/dL (ref 13.0–17.0)
MCHC: 33.9 g/dL (ref 30.0–36.0)
MCV: 91.7 fl (ref 78.0–100.0)
Platelets: 254 10*3/uL (ref 150.0–400.0)
RBC: 4.65 Mil/uL (ref 4.22–5.81)
RDW: 14.8 % (ref 11.5–15.5)
WBC: 5.9 10*3/uL (ref 4.0–10.5)

## 2020-06-03 LAB — PSA: PSA: 1.98 ng/mL (ref 0.10–4.00)

## 2020-06-03 LAB — COMPREHENSIVE METABOLIC PANEL
ALT: 25 U/L (ref 0–53)
AST: 20 U/L (ref 0–37)
Albumin: 4.3 g/dL (ref 3.5–5.2)
Alkaline Phosphatase: 65 U/L (ref 39–117)
BUN: 15 mg/dL (ref 6–23)
CO2: 27 mEq/L (ref 19–32)
Calcium: 9.3 mg/dL (ref 8.4–10.5)
Chloride: 105 mEq/L (ref 96–112)
Creatinine, Ser: 1.11 mg/dL (ref 0.40–1.50)
GFR: 76.88 mL/min (ref >60.00)
Glucose, Bld: 96 mg/dL (ref 70–99)
Potassium: 4.3 mEq/L (ref 3.5–5.1)
Sodium: 139 mEq/L (ref 135–145)
Total Bilirubin: 0.2 mg/dL (ref 0.2–1.2)
Total Protein: 6.6 g/dL (ref 6.0–8.3)

## 2020-06-03 LAB — LIPID PANEL
Cholesterol: 200 mg/dL (ref 0–200)
HDL: 45.2 mg/dL (ref >39.00)
LDL Cholesterol: 125 mg/dL — ABNORMAL HIGH (ref 0–99)
NonHDL: 155.29
Total CHOL/HDL Ratio: 4
Triglycerides: 152 mg/dL — ABNORMAL HIGH (ref 0.0–149.0)
VLDL: 30.4 mg/dL (ref 0.0–40.0)

## 2020-06-03 LAB — HEMOGLOBIN A1C: Hgb A1c MFr Bld: 6.4 % (ref 4.6–6.5)

## 2020-06-03 NOTE — Assessment & Plan Note (Signed)
Never filled sildenafil due to cost. No concerns today.

## 2020-06-03 NOTE — Assessment & Plan Note (Signed)
Doing well on citalopram 40 mg and naltrexone 50 mg, follows with psychiatry, continue current regimen.  No longer taking buspirone per patient preference.

## 2020-06-03 NOTE — Assessment & Plan Note (Signed)
PSA pending today for patient.

## 2020-06-03 NOTE — Assessment & Plan Note (Signed)
Chronic, wife has witnessed periods of apnea. Referral placed to pulmonology for evaluation.

## 2020-06-03 NOTE — Progress Notes (Signed)
Subjective:    Patient ID: Bobby Diaz, male    DOB: 05-08-1970, 50 y.o.   MRN: [id number]  HPI  This visit occurred during the SARS-CoV-2 public health emergency.  Safety protocols were in place, including screening questions prior to the visit, additional usage of staff PPE, and extensive cleaning of exam room while observing appropriate contact time as indicated for disinfecting solutions.   Bobby Diaz is a 50 year old male with a history of erectile dysfunction, sleep-work schedule disorder, GERD who presents today for follow up and a chief complaint of snoring. He is also due for colon cancer and prostate screening.   1) Anxiety and Depression: Currently managed on citalopram 40 mg, naltrexone 50 mg, and buspirone 5 mg BID. He stopped taking his buspirone as it "messed with my head". History of alcohol abuse years ago, about 6 months ago he began to experience cravings, was at home due to a "broken heel", was prescribed naltrexone for this reason. Currently following with psychiatry, Bobby Diaz, fluoxetine was ineffective and was switched to citalopram and buspirone.   2) Apnea: Occurs during sleep, five episodes during the night, witnessed by his wife each night. Family history of sleep apnea.   He has never completed a sleep study. He does feel tired during the day but has always attributed this to his day-night shift work pattern.  He does believe that he has a "deviated septum" has never under gone evaluation or imaging.  BP Readings from Last 3 Encounters:  06/03/20 132/84  12/12/19 126/82  02/23/19 134/86     Review of Systems  Eyes: Negative for visual disturbance.  Respiratory: Negative for shortness of breath.        Snoring   Cardiovascular: Negative for chest pain.  Neurological: Negative for dizziness and headaches.       Past Medical History:  Diagnosis Date  . Alcohol abuse   . Chickenpox   . Depression   . GERD (gastroesophageal reflux disease)       Social History   Socioeconomic History  . Marital status: Married    Spouse name: Not on file  . Number of children: Not on file  . Years of education: Not on file  . Highest education level: Not on file  Occupational History  . Not on file  Tobacco Use  . Smoking status: Current Every Day Smoker    Packs/day: 1.00  . Smokeless tobacco: Never Used  Substance and Sexual Activity  . Alcohol use: No  . Drug use: Not on file  . Sexual activity: Not on file  Other Topics Concern  . Not on file  Social History Narrative   Married.   4 children.   Works in [employer].   Enjoys fishing, being outdoors, golfing.   Social Determinants of Health   Financial Resource Strain: Not on file  Food Insecurity: Not on file  Transportation Needs: Not on file  Physical Activity: Not on file  Stress: Not on file  Social Connections: Not on file  Intimate Partner Violence: Not on file    History reviewed. No pertinent surgical history.  Family History  Problem Relation Age of Onset  . Depression Mother   . Mental illness Mother   . Cancer Father   . Alcohol abuse Brother   . Depression Brother   . Drug abuse Brother   . Mental illness Brother   . Cancer Maternal Grandmother        liver  . Depression  Maternal Grandmother   . Alcohol abuse Maternal Grandfather   . Depression Maternal Grandfather   . Heart disease Maternal Grandfather   . Hyperlipidemia Maternal Grandfather   . Hypertension Maternal Grandfather   . Heart disease Paternal Grandmother   . Hyperlipidemia Paternal Grandmother   . Hyperlipidemia Paternal Grandfather     Allergies  Allergen Reactions  . Influenza Vaccines     Other reaction(s): Other Guillain-Barre  . Tetanus Toxoids Other (See Comments)    Can only have the synthetic shot    Current Outpatient Medications on File Prior to Visit  Medication Sig Dispense Refill  . busPIRone (BUSPAR) 5 MG tablet Take 5 mg by mouth 2 (two) times daily.     . citalopram (CELEXA) 40 MG tablet Take 40 mg by mouth daily.    Marland Kitchen ibuprofen (ADVIL,MOTRIN) 800 MG tablet Take 1 tablet (800 mg total) by mouth every 8 (eight) hours as needed for moderate pain. 15 tablet 0  . naltrexone (DEPADE) 50 MG tablet Take by mouth.     No current facility-administered medications on file prior to visit.    BP 132/84   Pulse 75   Temp 98.4 F (36.9 C) (Temporal)   Ht 6' (1.829 m)   Wt 204 lb (92.5 kg)   SpO2 98%   BMI 27.67 kg/m    Objective:   Physical Exam Constitutional:      Appearance: He is well-nourished.  Cardiovascular:     Rate and Rhythm: Normal rate and regular rhythm.  Pulmonary:     Effort: Pulmonary effort is normal.     Breath sounds: Normal breath sounds.  Musculoskeletal:     Cervical back: Neck supple.  Skin:    General: Skin is warm and dry.  Psychiatric:        Mood and Affect: Mood and affect and mood normal.            Assessment & Plan:

## 2020-06-03 NOTE — Assessment & Plan Note (Signed)
Patient endorses a history of this. Labs pending today.

## 2020-06-03 NOTE — Patient Instructions (Signed)
Stop by the lab prior to leaving today. I will notify you of your results once received.   You will be contacted regarding your referral to GI for the colonoscopy and also to pulmonology for the sleep study.  Please let us know if you have not been contacted within two weeks.   It was a pleasure to see you today!

## 2020-06-04 LAB — HEPATITIS C ANTIBODY
Hepatitis C Ab: NONREACTIVE
SIGNAL TO CUT-OFF: 0 (ref <1.00)

## 2020-06-04 LAB — HIV ANTIBODY (ROUTINE TESTING W REFLEX): HIV 1&2 Ab, 4th Generation: NONREACTIVE

## 2020-06-05 DIAGNOSIS — R7303 Prediabetes: Secondary | ICD-10-CM

## 2020-06-05 DIAGNOSIS — E785 Hyperlipidemia, unspecified: Secondary | ICD-10-CM

## 2020-06-06 MED ORDER — ROSUVASTATIN CALCIUM 5 MG PO TABS: 5.0000 mg | ORAL_TABLET | Freq: Every day | ORAL | 3 refills | Status: DC

## 2020-06-30 ENCOUNTER — Encounter: Payer: Self-pay | Admitting: *Deleted

## 2020-07-02 ENCOUNTER — Encounter: Payer: Self-pay | Admitting: Primary Care

## 2020-07-02 ENCOUNTER — Other Ambulatory Visit: Payer: Self-pay

## 2020-07-02 ENCOUNTER — Ambulatory Visit (INDEPENDENT_AMBULATORY_CARE_PROVIDER_SITE_OTHER): Payer: 59 | Admitting: Primary Care

## 2020-07-02 VITALS — BP 120/62 | HR 97 | Temp 98.7°F | Ht 72.0 in | Wt 204.0 lb

## 2020-07-02 DIAGNOSIS — F419 Anxiety disorder, unspecified: Secondary | ICD-10-CM | POA: Diagnosis not present

## 2020-07-02 DIAGNOSIS — F32A Depression, unspecified: Secondary | ICD-10-CM

## 2020-07-02 MED ORDER — VENLAFAXINE HCL ER 37.5 MG PO CP24: 37.5000 mg | ORAL_CAPSULE | Freq: Every day | ORAL | 1 refills | Status: DC

## 2020-07-02 NOTE — Assessment & Plan Note (Signed)
Deteriorated. Follows with psychiatry.   Discussed options. He's failed three SSRI medications in the past, also cannot tolerate gabapentin or buspar.  Will wean off citalopram, start venlafaxine ER 37.5 mg. He will see his psychiatrist in a few weeks for follow up.

## 2020-07-02 NOTE — Progress Notes (Signed)
Subjective:    Patient ID: Bobby Diaz, male    DOB: 12/02/1970, 50 y.o.   MRN: [id number]  HPI  Bobby Diaz is a very pleasant 50 y.o. male with a history of anxiety and depression, sleep disturbance who presents today to discuss anxiety.  Currently managed on citalopram 40 mg for which he's been taking for the last 4-5 months. Buspirone 5 mg BID was added several months ago for ongoing anxiety, but he stopped taking as it made him feel "foggy". Gabapentin was then added which continued to make him feel "foggy" so he stopped taking. Currently following with psychiatry now, doesn't feel as though his anxiety is under great control.   He is currently just taking citalopram 40 mg and naltrexone 50 mg. He was once managed on Prozac for years, was up to 60 mg, but felt as though he hit a plateau. He was once on Lexapro for months, didn't notice improvement.  Symptoms include increased irritability, feels more tired, feeling down. He has a sleep study scheduled for April.    Review of Systems  Respiratory: Negative for shortness of breath.   Cardiovascular: Negative for chest pain.  Psychiatric/Behavioral: Positive for sleep disturbance. The patient is nervous/anxious.        See HPI         Past Medical History:  Diagnosis Date  . Alcohol abuse   . Chickenpox   . Depression   . GERD (gastroesophageal reflux disease)     Social History   Socioeconomic History  . Marital status: Married    Spouse name: Not on file  . Number of children: Not on file  . Years of education: Not on file  . Highest education level: Not on file  Occupational History  . Not on file  Tobacco Use  . Smoking status: Current Every Day Smoker    Packs/day: 1.00  . Smokeless tobacco: Never Used  Substance and Sexual Activity  . Alcohol use: No  . Drug use: Not on file  . Sexual activity: Not on file  Other Topics Concern  . Not on file  Social History Narrative   Married.   4  children.   Works in [employer].   Enjoys fishing, being outdoors, golfing.   Social Determinants of Health   Financial Resource Strain: Not on file  Food Insecurity: Not on file  Transportation Needs: Not on file  Physical Activity: Not on file  Stress: Not on file  Social Connections: Not on file  Intimate Partner Violence: Not on file    History reviewed. No pertinent surgical history.  Family History  Problem Relation Age of Onset  . Depression Mother   . Mental illness Mother   . Prostate cancer Father   . Alcohol abuse Brother   . Depression Brother   . Drug abuse Brother   . Mental illness Brother   . Cancer Maternal Grandmother        liver  . Depression Maternal Grandmother   . Alcohol abuse Maternal Grandfather   . Depression Maternal Grandfather   . Heart disease Maternal Grandfather   . Hyperlipidemia Maternal Grandfather   . Hypertension Maternal Grandfather   . Heart disease Paternal Grandmother   . Hyperlipidemia Paternal Grandmother   . Hyperlipidemia Paternal Grandfather     Allergies  Allergen Reactions  . Influenza Vaccines     Other reaction(s): Other Guillain-Barre  . Tetanus Toxoids Other (See Comments)    Can only have the  synthetic shot    Current Outpatient Medications on File Prior to Visit  Medication Sig Dispense Refill  . busPIRone (BUSPAR) 5 MG tablet Take 5 mg by mouth 2 (two) times daily.    . citalopram (CELEXA) 40 MG tablet Take 40 mg by mouth daily.    Marland Kitchen ibuprofen (ADVIL,MOTRIN) 800 MG tablet Take 1 tablet (800 mg total) by mouth every 8 (eight) hours as needed for moderate pain. 15 tablet 0  . naltrexone (DEPADE) 50 MG tablet Take by mouth.    . rosuvastatin (CRESTOR) 5 MG tablet Take 1 tablet (5 mg total) by mouth daily. For cholesterol 90 tablet 3   No current facility-administered medications on file prior to visit.    BP 120/62   Pulse 97   Temp 98.7 F (37.1 C) (Temporal)   Ht 6' (1.829 m)   Wt 204 lb (92.5  kg)   SpO2 98%   BMI 27.67 kg/m  Objective:   Physical Exam Cardiovascular:     Rate and Rhythm: Normal rate and regular rhythm.  Pulmonary:     Effort: Pulmonary effort is normal.     Breath sounds: Normal breath sounds. No wheezing or rales.  Musculoskeletal:     Cervical back: Neck supple.  Skin:    General: Skin is warm and dry.  Neurological:     Mental Status: He is alert and oriented to person, place, and time.  Psychiatric:        Mood and Affect: Mood normal.           Assessment & Plan:      This visit occurred during the SARS-CoV-2 public health emergency.  Safety protocols were in place, including screening questions prior to the visit, additional usage of staff PPE, and extensive cleaning of exam room while observing appropriate contact time as indicated for disinfecting solutions.

## 2020-07-02 NOTE — Patient Instructions (Signed)
Cut the citalopram tablet in half, take 1/2 tablet daily for one week, then stop.  Start venlafaxine ER 37.5 mg for anxiety and depression. You can start this while you cut down on 1/2 tablet of citalopram.   Continue the venlafaxine ER 37.5 mg daily.   Follow up with your psychiatrist as scheduled.   It was a pleasure to see you today!

## 2020-07-18 DIAGNOSIS — F32A Depression, unspecified: Secondary | ICD-10-CM

## 2020-07-24 ENCOUNTER — Other Ambulatory Visit: Payer: Self-pay | Admitting: Primary Care

## 2020-07-24 DIAGNOSIS — F419 Anxiety disorder, unspecified: Secondary | ICD-10-CM

## 2020-07-24 DIAGNOSIS — F32A Depression, unspecified: Secondary | ICD-10-CM

## 2020-07-24 NOTE — Telephone Encounter (Signed)
He is supposed to update me next week, we may be increasing his dose.  Does he still want 90 of the 37.5 mg dose if we may increase his dose?

## 2020-07-28 NOTE — Telephone Encounter (Signed)
Left message to return call to our office.  

## 2020-07-29 MED ORDER — VENLAFAXINE HCL ER 75 MG PO CP24: 75.0000 mg | ORAL_CAPSULE | Freq: Every day | ORAL | 1 refills | Status: DC

## 2020-07-29 NOTE — Telephone Encounter (Signed)
Patient is returning a call back from [place].

## 2020-07-29 NOTE — Telephone Encounter (Signed)
Called patient he did not call for refill. Will send my chart message to update on meds next week.

## 2020-07-29 NOTE — Telephone Encounter (Signed)
Left message to return call to our office.  

## 2020-07-31 ENCOUNTER — Encounter: Payer: Self-pay | Admitting: Pulmonary Disease

## 2020-07-31 ENCOUNTER — Ambulatory Visit (INDEPENDENT_AMBULATORY_CARE_PROVIDER_SITE_OTHER): Payer: 59 | Admitting: Pulmonary Disease

## 2020-07-31 ENCOUNTER — Other Ambulatory Visit: Payer: Self-pay

## 2020-07-31 VITALS — BP 130/78 | HR 85 | Temp 97.7°F | Ht 70.5 in | Wt 197.0 lb

## 2020-07-31 DIAGNOSIS — N200 Calculus of kidney: Secondary | ICD-10-CM | POA: Insufficient documentation

## 2020-07-31 DIAGNOSIS — R0683 Snoring: Secondary | ICD-10-CM

## 2020-07-31 DIAGNOSIS — G4726 Circadian rhythm sleep disorder, shift work type: Secondary | ICD-10-CM

## 2020-07-31 DIAGNOSIS — F419 Anxiety disorder, unspecified: Secondary | ICD-10-CM | POA: Insufficient documentation

## 2020-07-31 NOTE — Patient Instructions (Signed)
Will arrange for home sleep study Will call to arrange for follow up after sleep study reviewed  

## 2020-07-31 NOTE — Progress Notes (Signed)
[facility]  Chief Complaint  Patient presents with  . Consult    Snoring, difficulty sleeping.    Constitutional:  BP 130/78 (BP Location: Left Arm, Patient Position: Sitting, Cuff Size: Normal)   Pulse 85   Temp 97.7 F (36.5 C) (Temporal)   Ht 5' 10.5" (1.791 m)   Wt 197 lb (89.4 kg)   SpO2 99%   BMI 27.87 kg/m   Past Medical History:  ETOH, Depression, GERD  Past Surgical History:  He  has a past surgical history that includes Tonsillectomy.  Brief Summary:  Bobby Diaz is a 50 y.o. male smoker with snoring.  He works in [employer] and has rotating shift schedule.      Subjective:   His wife has been concerned about his snoring.  She says he stops breathing while asleep.  He wakes up hearing himself snore.  He is getting more tired during the day, and can fall asleep when sitting quiet.  His father has sleep apnea and had surgery for this.  He works in [employer].  He has a rotating shift schedule, so it is difficult to maintain regular sleep/wake times.  He wakes up usually one time to use the bathroom.  He gets funny leg feelings occasional, but these are mild.  He is not using anything to help sleep or stay away.  He occasional gets a click in his jaw when he chews.  He has trouble breathing through the right side of his nose sometimes.  He quite drinking alcohol several years ago.  He denies sleep walking, sleep talking, bruxism, or nightmares.  He denies sleep hallucinations, sleep paralysis, or cataplexy.  The Epworth score is 7 out of 24.   Physical Exam:   Appearance - well kempt   ENMT - no sinus tenderness, no oral exudate, no LAN, Mallampati 2 airway, no stridor, low laying soft palate, decreased AP diameter, narrow nasal angles  Respiratory - equal breath sounds bilaterally, no wheezing or rales  CV - s1s2 regular rate and rhythm, no murmurs  Ext - no clubbing, no edema  Skin - no  rashes  Psych - normal mood and affect   Sleep Tests:    Social History:  He  reports that he has been smoking. He has been smoking about 0.50 packs per day. He has never used smokeless tobacco. He reports that he does not drink alcohol.  Family History:  His family history includes Alcohol abuse in his brother and maternal grandfather; Cancer in his maternal grandmother; Depression in his brother, maternal grandfather, maternal grandmother, and mother; Drug abuse in his brother; Heart disease in his maternal grandfather and paternal grandmother; Hyperlipidemia in his maternal grandfather, paternal grandfather, and paternal grandmother; Hypertension in his maternal grandfather; Mental illness in his brother and mother; Prostate cancer in his father.    Discussion:  He has snoring, sleep disruption, apnea, and daytime sleepiness.  He has family history of obstructive sleep apnea.  He has history of depression.  I am concerned he could have obstructive sleep apnea.  Assessment/Plan:   Snoring with excessive daytime sleepiness. - will need to arrange for a home sleep study  Rotating shift schedule. - discussed importance of proper sleep hygiene and maintaining regular sleep/wake schedule as able  Tobacco abuse. - will need to review smoking cessation options again at next visit  Obesity. - discussed how weight can impact sleep and risk for sleep disordered breathing - discussed  options to assist with weight loss: combination of diet modification, cardiovascular and strength training exercises  Cardiovascular risk. - had an extensive discussion regarding the adverse health consequences related to untreated sleep disordered breathing - specifically discussed the risks for hypertension, coronary artery disease, cardiac dysrhythmias, cerebrovascular disease, and diabetes - lifestyle modification discussed  Safe driving practices. - discussed how sleep disruption can increase risk of  accidents, particularly when driving - safe driving practices were discussed  Therapies for obstructive sleep apnea. - if the sleep study shows significant sleep apnea, then various therapies for treatment were reviewed: CPAP, oral appliance, and surgical interventions   Time Spent Involved in Patient Care on Day of Examination:  33 minutes  Follow up:  Patient Instructions  Will arrange for home sleep study Will call to arrange for follow up after sleep study reviewed    Medication List:   Allergies as of 07/31/2020      Reactions   Influenza Vaccines    Other reaction(s): Other Guillain-Barre   Tetanus Toxoids Other (See Comments)   Can only have the synthetic shot      Medication List       Accurate as of July 31, 2020  9:55 AM. If you have any questions, ask your nurse or doctor.        ibuprofen 800 MG tablet Commonly known as: ADVIL Take 1 tablet (800 mg total) by mouth every 8 (eight) hours as needed for moderate pain.   naltrexone 50 MG tablet Commonly known as: DEPADE Take by mouth.   rosuvastatin 5 MG tablet Commonly known as: Crestor Take 1 tablet (5 mg total) by mouth daily. For cholesterol   venlafaxine XR 75 MG 24 hr capsule Commonly known as: Effexor XR Take 1 capsule (75 mg total) by mouth daily with breakfast. For anxiety and depression.       Signature:  Coralyn Helling, MD [facility] Pulmonary/Critical Care Pager - [phone] 07/31/2020, 9:55 AM

## 2020-08-08 ENCOUNTER — Other Ambulatory Visit: Payer: Self-pay

## 2020-08-08 ENCOUNTER — Ambulatory Visit: Payer: 59

## 2020-08-08 DIAGNOSIS — G4733 Obstructive sleep apnea (adult) (pediatric): Secondary | ICD-10-CM

## 2020-08-08 DIAGNOSIS — R0683 Snoring: Secondary | ICD-10-CM

## 2020-08-11 ENCOUNTER — Telehealth: Payer: Self-pay | Admitting: Pulmonary Disease

## 2020-08-11 DIAGNOSIS — G4733 Obstructive sleep apnea (adult) (pediatric): Secondary | ICD-10-CM | POA: Diagnosis not present

## 2020-08-11 NOTE — Telephone Encounter (Signed)
HST 08/09/20 >> 29.1, SpO2 low 78%  Please inform him that his sleep study shows moderate to severe obstructive sleep apnea.  Please arrange for ROV with me or NP to discuss treatment options.

## 2020-08-12 NOTE — Telephone Encounter (Signed)
Called and went over HST results per Dr Craige Cotta with patient. All questions answered and patient expressed full understanding. Scheduled office visit with NP for Friday 08/15/2020 at 9:30am at the [facility] office. Patient agreeable to time, date and location. Nothing further needed at this time.

## 2020-08-15 ENCOUNTER — Other Ambulatory Visit: Payer: Self-pay

## 2020-08-15 ENCOUNTER — Ambulatory Visit (INDEPENDENT_AMBULATORY_CARE_PROVIDER_SITE_OTHER): Payer: 59 | Admitting: Adult Health

## 2020-08-15 ENCOUNTER — Encounter: Payer: Self-pay | Admitting: Adult Health

## 2020-08-15 DIAGNOSIS — G4726 Circadian rhythm sleep disorder, shift work type: Secondary | ICD-10-CM | POA: Diagnosis not present

## 2020-08-15 DIAGNOSIS — G4733 Obstructive sleep apnea (adult) (pediatric): Secondary | ICD-10-CM

## 2020-08-15 NOTE — Assessment & Plan Note (Signed)
Moderate to severe obstructive sleep apnea. Patient education given.  We will begin CPAP therapy.  Begin CPAP 5 to 15 cm H2O. DreamWear nasal mask.  Plan  Patient Instructions  Begin CPAP At bedtime   Need to wear CPAP all night for at least 4hrs or more .  Healthy sleep regimen .  Do not drive if sleepy.  Follow up with Dr. Craige Cotta or Aala Ransom NP in 2-3 months and As needed

## 2020-08-15 NOTE — Patient Instructions (Addendum)
Begin CPAP At bedtime   Need to wear CPAP all night for at least 4hrs or more .  Healthy sleep regimen .  Do not drive if sleepy.  Follow up with Dr. Craige Cotta or Itali Mckendry NP in 2-3 months and As needed

## 2020-08-15 NOTE — Progress Notes (Signed)
@Patient  ID: , male    DOB: 31-Aug-1970, 50 y.o.   MRN: [id number]  Chief Complaint  Patient presents with  . Follow-up    Referring provider: [id number], NP  HPI: 50 year old male active smoker seen for sleep consult July 31, 2020 for daytime sleepiness, snoring, witnessed apneic events found to have moderate to severe obstructive sleep apnea on home sleep study Patient works in August 02, 2020 with shift work.  TEST/EVENTS :  HST 08/09/20 >> 29.1, SpO2 low 78%  08/15/2020 Follow up : OSA  Patient returns for a 3-week follow-up.  Patient was seen last visit for a sleep consult.  Patient had ongoing daytime sleepiness, snoring, witnessed apneic events.  Patient was set up for a home sleep study that showed moderate to severe obstructive sleep apnea.  Home sleep study on August 09, 2020 showed AHI at 29.1, SPO2 low at 78%.  We discussed his sleep study results with patient education on sleep apnea and potential cardiovascular risks.  We went over treatment options including weight loss, oral appliance and CPAP therapy.  Patient will proceed with CPAP therapy.  Allergies  Allergen Reactions  . Influenza Vaccines     Other reaction(s): Other Guillain-Barre  . Tetanus Toxoids Other (See Comments)    Can only have the synthetic shot    Immunization History  Administered Date(s) Administered  . Hep A / Hep B 06/19/2013    Past Medical History:  Diagnosis Date  . Alcohol abuse   . Chickenpox   . Depression   . GERD (gastroesophageal reflux disease)     Tobacco History: Social History   Tobacco Use  Smoking Status Current Every Day Smoker  . Packs/day: 1.00  . Types: Cigarettes  Smokeless Tobacco Never Used  Tobacco Comment   currently .50 packs per day.   Ready to quit: Not Answered Counseling given: Not Answered Comment: currently .50 packs per day.   Outpatient Medications Prior to Visit  Medication Sig Dispense Refill  . ibuprofen  (ADVIL,MOTRIN) 800 MG tablet Take 1 tablet (800 mg total) by mouth every 8 (eight) hours as needed for moderate pain. 15 tablet 0  . naltrexone (DEPADE) 50 MG tablet Take by mouth.    . rosuvastatin (CRESTOR) 5 MG tablet Take 1 tablet (5 mg total) by mouth daily. For cholesterol 90 tablet 3  . venlafaxine XR (EFFEXOR XR) 75 MG 24 hr capsule Take 1 capsule (75 mg total) by mouth daily with breakfast. For anxiety and depression. 90 capsule 1   No facility-administered medications prior to visit.     Review of Systems:   Constitutional:   No  weight loss, night sweats,  Fevers, chills, fatigue, or  lassitude.  HEENT:   No headaches,  Difficulty swallowing,  Tooth/dental problems, or  Sore throat,                No sneezing, itching, ear ache,  +nasal congestion, post nasal drip,   CV:  No chest pain,  Orthopnea, PND, swelling in lower extremities, anasarca, dizziness, palpitations, syncope.   GI  No heartburn, indigestion, abdominal pain, nausea, vomiting, diarrhea, change in bowel habits, loss of appetite, bloody stools.   Resp: No shortness of breath with exertion or at rest.  No excess mucus, no productive cough,  No non-productive cough,  No coughing up of blood.  No change in color of mucus.  No wheezing.  No chest wall deformity  Skin: no rash or lesions.  GU: no  dysuria, change in color of urine, no urgency or frequency.  No flank pain, no hematuria   MS:  No joint pain or swelling.  No decreased range of motion.  No back pain.    Physical Exam  BP 126/76 (BP Location: Left Arm, Patient Position: Sitting, Cuff Size: Normal)   Pulse 77   Temp 97.9 F (36.6 C) (Temporal)   Ht 6' (1.829 m)   Wt 196 lb 6.4 oz (89.1 kg)   SpO2 97%   BMI 26.64 kg/m   GEN: A/Ox3; pleasant , NAD, well nourished    HEENT:  [place]/AT,    NOSE-clear, THROAT-clear, no lesions, no postnasal drip or exudate noted.  Class II-III MP airway  NECK:  Supple w/ fair ROM; no JVD; normal carotid impulses w/o  bruits; no thyromegaly or nodules palpated; no lymphadenopathy.    RESP  Clear  P & A; w/o, wheezes/ rales/ or rhonchi. no accessory muscle use, no dullness to percussion  CARD:  RRR, no m/r/g, no peripheral edema, pulses intact, no cyanosis or clubbing.  GI:   Soft & nt; nml bowel sounds; no organomegaly or masses detected.   Musco: Warm bil, no deformities or joint swelling noted.   Neuro: alert, no focal deficits noted.    Skin: Warm, no lesions or rashes    Lab Results:   BNP No results found for: BNP  ProBNP No results found for: PROBNP  Imaging: No results found.    No flowsheet data found.  No results found for: NITRICOXIDE      Assessment & Plan:   OSA (obstructive sleep apnea) Moderate to severe obstructive sleep apnea. Patient education given.  We will begin CPAP therapy.  Begin CPAP 5 to 15 cm H2O. DreamWear nasal mask.  Plan  Patient Instructions  Begin CPAP At bedtime   Need to wear CPAP all night for at least 4hrs or more .  Healthy sleep regimen .  Do not drive if sleepy.  Follow up with Dr. Craige Cotta or Fleur Audino NP in 2-3 months and As needed        Shifting sleep-work schedule, affecting sleep Healthy sleep regimen discussed.     Rubye Oaks, NP 08/15/2020

## 2020-08-15 NOTE — Addendum Note (Signed)
Addended by: Delrae Rend on: 08/15/2020 10:58 AM   Modules accepted: Orders

## 2020-08-15 NOTE — Assessment & Plan Note (Signed)
Healthy sleep regimen discussed  °

## 2020-09-29 DIAGNOSIS — F419 Anxiety disorder, unspecified: Secondary | ICD-10-CM

## 2020-10-14 ENCOUNTER — Ambulatory Visit: Payer: 59 | Admitting: Primary Care

## 2020-10-15 MED ORDER — VENLAFAXINE HCL ER 37.5 MG PO CP24: 37.5000 mg | ORAL_CAPSULE | Freq: Every day | ORAL | 0 refills | Status: DC

## 2021-01-16 ENCOUNTER — Telehealth: Payer: Self-pay | Admitting: Pulmonary Disease

## 2021-01-16 NOTE — Telephone Encounter (Signed)
ATC patient to get him scheduled for follow up appt since he was set up with his CPAP machine on 01/13/21. LMTCB   When patient calls back please schedule him for an appointment after 02/13/21 either in [place] or [place] with [place] or APP

## 2021-07-29 ENCOUNTER — Encounter: Payer: Self-pay | Admitting: Primary Care

## 2021-07-29 ENCOUNTER — Ambulatory Visit (INDEPENDENT_AMBULATORY_CARE_PROVIDER_SITE_OTHER): Payer: 59 | Admitting: Primary Care

## 2021-07-29 VITALS — BP 160/98 | HR 89 | Temp 98.2°F | Ht 72.0 in | Wt 201.0 lb

## 2021-07-29 DIAGNOSIS — F17200 Nicotine dependence, unspecified, uncomplicated: Secondary | ICD-10-CM | POA: Diagnosis not present

## 2021-07-29 DIAGNOSIS — E785 Hyperlipidemia, unspecified: Secondary | ICD-10-CM | POA: Diagnosis not present

## 2021-07-29 DIAGNOSIS — Z125 Encounter for screening for malignant neoplasm of prostate: Secondary | ICD-10-CM | POA: Diagnosis not present

## 2021-07-29 DIAGNOSIS — R7303 Prediabetes: Secondary | ICD-10-CM | POA: Diagnosis not present

## 2021-07-29 DIAGNOSIS — I10 Essential (primary) hypertension: Secondary | ICD-10-CM

## 2021-07-29 DIAGNOSIS — G4733 Obstructive sleep apnea (adult) (pediatric): Secondary | ICD-10-CM

## 2021-07-29 DIAGNOSIS — K219 Gastro-esophageal reflux disease without esophagitis: Secondary | ICD-10-CM

## 2021-07-29 MED ORDER — OLMESARTAN MEDOXOMIL 20 MG PO TABS: 20.0000 mg | ORAL_TABLET | Freq: Every day | ORAL | 0 refills | Status: DC

## 2021-07-29 MED ORDER — CHANTIX STARTING MONTH PAK 0.5 MG X 11 & 1 MG X 42 PO TBPK: ORAL_TABLET | ORAL | 0 refills | Status: DC

## 2021-07-29 NOTE — Assessment & Plan Note (Signed)
Ready for treatment if applicable. ? ?Repeat lipid panel pending. ?He does have a prescription for rosuvastatin 5 mg at home. ? ?Await lab results. ?

## 2021-07-29 NOTE — Assessment & Plan Note (Signed)
Compliant to CPAP machine nightly, continue same. 

## 2021-07-29 NOTE — Assessment & Plan Note (Signed)
Two recent documented readings of elevated blood pressure. ? ?Given family history, coupled with readings today, will treat. ? ?Start olmesartan 20 mg daily. ?We discussed to monitor his blood pressure daily at home. ? ?Follow-up in 2 to 3 weeks for blood pressure check and BMP. ?

## 2021-07-29 NOTE — Progress Notes (Signed)
? ?Subjective:  ? ? Patient ID: Bobby Diaz, male    DOB: [birth year], 51 y.o.   MRN: [id number] ? ?HPI ? ?Bobby Diaz is a very pleasant 51 y.o. male with a history of OSA, hyperlipidemia, anxiety, depression, Guillain-Barr? syndrome postvaccination who presents today to discuss elevated blood pressure readings and tobacco abuse, and is requesting labs. His wife joins him today.  ? ?1) Elevated Blood Pressure: No formal hypertension diagnosis. He was notified that his BP was "high" two weeks ago while at his appointment to have his Naltrexone refilled. He checked his BP this morning at home which was 150/96. He denies chest pain, headaches, dizziness.  ? ?His wife endorses a poor diet as he eats ice cream, honey buns, fried food, and drinks [employer] quite frequently. He does drink some water during working hours.  Last year he improved his diet for short period of time as he wanted to improve his cholesterol and blood sugar without treatment.  Unfortunately, he was unable to sustain his improve diet for very long. ? ?He has a family history of heart disease, hypertension, and heart failure in his father.  ? ?2) Prediabetes/Hyperlipidemia: History of prediabetes, last A1c drawn was 6.4 in February 2022.  At the time was recommended we repeat his A1c 3 months later, he never had this done. ? ?Also with a history of hyperlipidemia with ASCVD risk score of 9.5% in February 2022.  A prescription for rosuvastatin 5 mg was sent to the pharmacy at the time.  Today he endorses never taking rosuvastatin as he wanted to improve his cholesterol levels through lifestyle changes.  ? ?BP Readings from Last 3 Encounters:  ?07/29/21 (!) 158/76  ?08/15/20 126/76  ?07/31/20 130/78  ? ?3) Tobacco Abuse: Chronic smoker since the age of 36, has smoked consistently without quitting since then. Smokes 1 PPD. Family history of lung cancer in his father who has since passed away. ? ?He would like to try Chantix for  smoking cessation. He was prescribed Chantix years ago, had vivid dreams, was able to quit for a few days but stopped taking Chantix as his friend stopped taking it as well. ? ?4) GERD: Chronic use of BC powder for years. Over the last 6 months his wife has purchased more Tums than usual. Symptoms include upper esophageal burning, throat fullness, belching which occur twice weekly on average. He has never undergone upper endoscopy or colonoscopy.  He takes BC powder "for caffeine purposes".  He denies rectal bleeding, abdominal pain. ? ? ?Review of Systems  ?Respiratory:  Negative for shortness of breath.   ?Cardiovascular:  Negative for chest pain.  ?Gastrointestinal:  Negative for abdominal pain and blood in stool.  ?Neurological:  Negative for dizziness and headaches.  ? ?   ? ? ?Past Medical History:  ?Diagnosis Date  ? Alcohol abuse   ? Chickenpox   ? Depression   ? GERD (gastroesophageal reflux disease)   ? ? ?Social History  ? ?Socioeconomic History  ? Marital status: Married  ?  Spouse name: Not on file  ? Number of children: Not on file  ? Years of education: Not on file  ? Highest education level: Not on file  ?Occupational History  ? Not on file  ?Tobacco Use  ? Smoking status: Every Day  ?  Packs/day: 1.00  ?  Types: Cigarettes  ? Smokeless tobacco: Never  ? Tobacco comments:  ?  currently .50 packs per day.  ?Substance and Sexual  Activity  ? Alcohol use: No  ? Drug use: Not on file  ? Sexual activity: Not on file  ?Other Topics Concern  ? Not on file  ?Social History Narrative  ? Married.  ? 4 children.  ? Works in [employer].  ? Enjoys fishing, being outdoors, golfing.  ? ?Social Determinants of Health  ? ?Financial Resource Strain: Not on file  ?Food Insecurity: Not on file  ?Transportation Needs: Not on file  ?Physical Activity: Not on file  ?Stress: Not on file  ?Social Connections: Not on file  ?Intimate Partner Violence: Not on file  ? ? ?Past Surgical History:  ?Procedure Laterality Date  ?  TONSILLECTOMY    ? ? ?Family History  ?Problem Relation Age of Onset  ? Depression Mother   ? Mental illness Mother   ? Prostate cancer Father   ? Alcohol abuse Brother   ? Depression Brother   ? Drug abuse Brother   ? Mental illness Brother   ? Cancer Maternal Grandmother   ?     liver  ? Depression Maternal Grandmother   ? Alcohol abuse Maternal Grandfather   ? Depression Maternal Grandfather   ? Heart disease Maternal Grandfather   ? Hyperlipidemia Maternal Grandfather   ? Hypertension Maternal Grandfather   ? Heart disease Paternal Grandmother   ? Hyperlipidemia Paternal Grandmother   ? Hyperlipidemia Paternal Grandfather   ? ? ?Allergies  ?Allergen Reactions  ? Influenza Vaccines   ?  Other reaction(s): Other ?Guillain-Barre  ? Tetanus Toxoids Other (See Comments)  ?  Can only have the synthetic shot  ? ? ?Current Outpatient Medications on File Prior to Visit  ?Medication Sig Dispense Refill  ? ibuprofen (ADVIL,MOTRIN) 800 MG tablet Take 1 tablet (800 mg total) by mouth every 8 (eight) hours as needed for moderate pain. 15 tablet 0  ? naltrexone (DEPADE) 50 MG tablet Take by mouth.    ? venlafaxine XR (EFFEXOR XR) 37.5 MG 24 hr capsule Take 1 capsule (37.5 mg total) by mouth daily with breakfast. For anxiety and depression. Take with 75 mg dose. 90 capsule 0  ? venlafaxine XR (EFFEXOR XR) 75 MG 24 hr capsule Take 1 capsule (75 mg total) by mouth daily with breakfast. For anxiety and depression. 90 capsule 1  ? ?No current facility-administered medications on file prior to visit.  ? ? ?BP (!) 158/76 (BP Location: Left Arm, Patient Position: Sitting, Cuff Size: Normal)   Pulse 89   Temp 98.2 ?F (36.8 ?C) (Temporal)   Ht 6' (1.829 m)   Wt 201 lb (91.2 kg)   SpO2 98%   BMI 27.26 kg/m?  ?Objective:  ? Physical Exam ?Cardiovascular:  ?   Rate and Rhythm: Normal rate and regular rhythm.  ?Pulmonary:  ?   Effort: Pulmonary effort is normal.  ?   Breath sounds: Normal breath sounds. No wheezing or rales.   ?Abdominal:  ?   General: Bowel sounds are normal.  ?   Palpations: Abdomen is soft.  ?   Tenderness: There is no abdominal tenderness.  ?Musculoskeletal:  ?   Cervical back: Neck supple.  ?Skin: ?   General: Skin is warm and dry.  ?Neurological:  ?   Mental Status: He is alert and oriented to person, place, and time.  ?Psychiatric:     ?   Mood and Affect: Mood normal.  ? ? ? ? ? ?   ?Assessment & Plan:  ? ? ? ? ?This visit occurred  during the SARS-CoV-2 public health emergency.  Safety protocols were in place, including screening questions prior to the visit, additional usage of staff PPE, and extensive cleaning of exam room while observing appropriate contact time as indicated for disinfecting solutions.  ?

## 2021-07-29 NOTE — Patient Instructions (Signed)
Stop by the lab prior to leaving today. I will notify you of your results once received.  ? ?Stop taking BC Powder. ? ?Start olmesartan 20 mg tablets for blood pressure.  Take 1 tablet by mouth once daily. ? ?Start checking your blood pressure once daily at home. ? ?Before you start Chantix you must choose a quit date.  You should stop smoking within 1 to 2 weeks of taking the medication.  Follow the package instructions. ? ?Consider a colonoscopy and lung cancer screening. ? ?Please schedule a follow up visit to meet back with me in 2-3 weeks for blood pressure check.  ? ?It was a pleasure to see you today! ? ? ? ? ?

## 2021-07-29 NOTE — Assessment & Plan Note (Signed)
Repeat A1c pending. ? ?Long discussion today regarding what constitutes a healthy diet for heart health and diabetes. ? ?Await results. ?

## 2021-07-29 NOTE — Assessment & Plan Note (Signed)
Ready to quit. ? ?Long discussion today regarding Chantix, instructions, potential side effects.  Prescription for Chantix starter pack sent to pharmacy. ? ?He will keep [place] updated. ? ?Offered lung cancer screening program referral, he currently declines but will think about this.  ?

## 2021-07-29 NOTE — Assessment & Plan Note (Signed)
Discussed to stop BC powder consumption immediately. ? ?He does not seem to be experiencing symptoms of gastric or esophageal ulcers.  Continue Tums as needed, but will consider H2 blocker for consistent use if warranted. ? ?Continue to closely monitor. ?

## 2021-07-30 LAB — COMPREHENSIVE METABOLIC PANEL
ALT: 23 U/L (ref 0–53)
AST: 18 U/L (ref 0–37)
Albumin: 4.5 g/dL (ref 3.5–5.2)
Alkaline Phosphatase: 79 U/L (ref 39–117)
BUN: 20 mg/dL (ref 6–23)
CO2: 29 mEq/L (ref 19–32)
Calcium: 10 mg/dL (ref 8.4–10.5)
Chloride: 104 mEq/L (ref 96–112)
Creatinine, Ser: 1.24 mg/dL (ref 0.40–1.50)
GFR: 66.77 mL/min (ref >60.00)
Glucose, Bld: 78 mg/dL (ref 70–99)
Potassium: 4.3 mEq/L (ref 3.5–5.1)
Sodium: 140 mEq/L (ref 135–145)
Total Bilirubin: 0.5 mg/dL (ref 0.2–1.2)
Total Protein: 6.9 g/dL (ref 6.0–8.3)

## 2021-07-30 LAB — CBC
HCT: 43.8 % (ref 39.0–52.0)
Hemoglobin: 15 g/dL (ref 13.0–17.0)
MCHC: 34.2 g/dL (ref 30.0–36.0)
MCV: 89.4 fl (ref 78.0–100.0)
Platelets: 260 10*3/uL (ref 150.0–400.0)
RBC: 4.9 Mil/uL (ref 4.22–5.81)
RDW: 14.2 % (ref 11.5–15.5)
WBC: 7.4 10*3/uL (ref 4.0–10.5)

## 2021-07-30 LAB — LIPID PANEL
Cholesterol: 211 mg/dL — ABNORMAL HIGH (ref 0–200)
HDL: 36.4 mg/dL — ABNORMAL LOW (ref >39.00)
NonHDL: 174.83
Total CHOL/HDL Ratio: 6
Triglycerides: 304 mg/dL — ABNORMAL HIGH (ref 0.0–149.0)
VLDL: 60.8 mg/dL — ABNORMAL HIGH (ref 0.0–40.0)

## 2021-07-30 LAB — PSA: PSA: 2.45 ng/mL (ref 0.10–4.00)

## 2021-07-30 LAB — HEMOGLOBIN A1C: Hgb A1c MFr Bld: 6.1 % (ref 4.6–6.5)

## 2021-07-30 LAB — LDL CHOLESTEROL, DIRECT: Direct LDL: 136 mg/dL

## 2021-08-14 ENCOUNTER — Ambulatory Visit (INDEPENDENT_AMBULATORY_CARE_PROVIDER_SITE_OTHER): Payer: 59 | Admitting: Primary Care

## 2021-08-14 ENCOUNTER — Encounter: Payer: Self-pay | Admitting: Primary Care

## 2021-08-14 VITALS — BP 146/88 | HR 94 | Ht 72.0 in | Wt 205.0 lb

## 2021-08-14 DIAGNOSIS — F17200 Nicotine dependence, unspecified, uncomplicated: Secondary | ICD-10-CM | POA: Diagnosis not present

## 2021-08-14 DIAGNOSIS — I10 Essential (primary) hypertension: Secondary | ICD-10-CM

## 2021-08-14 LAB — BASIC METABOLIC PANEL
BUN: 19 mg/dL (ref 6–23)
CO2: 26 mEq/L (ref 19–32)
Calcium: 8.9 mg/dL (ref 8.4–10.5)
Chloride: 105 mEq/L (ref 96–112)
Creatinine, Ser: 1.2 mg/dL (ref 0.40–1.50)
GFR: 69.43 mL/min (ref >60.00)
Glucose, Bld: 86 mg/dL (ref 70–99)
Potassium: 3.9 mEq/L (ref 3.5–5.1)
Sodium: 139 mEq/L (ref 135–145)

## 2021-08-14 MED ORDER — AMLODIPINE-OLMESARTAN 5-20 MG PO TABS: 1.0000 | ORAL_TABLET | Freq: Every day | ORAL | 0 refills | Status: DC

## 2021-08-14 NOTE — Patient Instructions (Addendum)
Stop by the lab prior to leaving today. I will notify you of your results once received.  ? ?Stop taking olmesartan 20 mg daily for blood pressure. ? ?Start taking amlodipine-olmesartan 5-20 mg tablets once daily for blood pressure. ? ?Continue to monitor your blood pressure at home. ? ?Please schedule a follow up visit to meet back with me in 2-3 weeks for blood pressure check.  ? ?It was a pleasure to see you today! ? ?

## 2021-08-14 NOTE — Progress Notes (Signed)
? ?Subjective:  ? ? Patient ID: Bobby Diaz, male    DOB: 05-Jun-1970, 51 y.o.   MRN: [id number] ? ?HPI ? ?Bobby Diaz is a very pleasant 51 y.o. male with a history of prediabetes, tobacco abuse, hyperlipidemia, hypertension who presents today for follow-up of hypertension and tobacco abuse. ? ?He was last evaluated on 07/29/2021 for follow-up.  Blood pressure had been elevated on numerous occasions including doctors appointments.  He had no formal diagnosis of hypertension.  Given his elevated readings and family history we decided to initiate olmesartan 20 mg daily.  He is here for follow-up today. ? ?Since his last visit he is checking BP at home which is running 130's-150's/70's-90's. He denies dizziness, chest pain. He has since stopped taking BC powder.  He is compliant to olmesartan 20 mg daily. ? ?He has since improved his diet by cutting out sugary foods and drinks.  ? ?BP Readings from Last 3 Encounters:  ?08/14/21 (!) 146/88  ?07/29/21 (!) 160/98  ?08/15/20 126/76  ? ? ? ?During his last visit he also mentioned readiness to quit smoking cigarettes so Chantix starter pack was prescribed and sent to his pharmacy.  He declined lung cancer screening at the time. ? ?Since his last visit he started Chantix today. He has yet to choose a quit date. He is motivated to quit.  ? ? ?Review of Systems  ?Eyes:  Negative for visual disturbance.  ?Respiratory:  Negative for shortness of breath.   ?Cardiovascular:  Negative for chest pain.  ?Neurological:  Negative for dizziness and headaches.  ? ?   ? ? ?Past Medical History:  ?Diagnosis Date  ? Alcohol abuse   ? Chickenpox   ? Depression   ? GERD (gastroesophageal reflux disease)   ? Snoring 06/03/2020  ? ? ?Social History  ? ?Socioeconomic History  ? Marital status: Married  ?  Spouse name: Not on file  ? Number of children: Not on file  ? Years of education: Not on file  ? Highest education level: Not on file  ?Occupational History  ? Not on file   ?Tobacco Use  ? Smoking status: Every Day  ?  Packs/day: 1.00  ?  Types: Cigarettes  ? Smokeless tobacco: Never  ? Tobacco comments:  ?  currently .50 packs per day.  ?Vaping Use  ? Vaping Use: Never used  ?Substance and Sexual Activity  ? Alcohol use: No  ? Drug use: Not on file  ? Sexual activity: Not on file  ?Other Topics Concern  ? Not on file  ?Social History Narrative  ? Married.  ? 4 children.  ? Works in [employer].  ? Enjoys fishing, being outdoors, golfing.  ? ?Social Determinants of Health  ? ?Financial Resource Strain: Not on file  ?Food Insecurity: Not on file  ?Transportation Needs: Not on file  ?Physical Activity: Not on file  ?Stress: Not on file  ?Social Connections: Not on file  ?Intimate Partner Violence: Not on file  ? ? ?Past Surgical History:  ?Procedure Laterality Date  ? TONSILLECTOMY    ? ? ?Family History  ?Problem Relation Age of Onset  ? Depression Mother   ? Mental illness Mother   ? Prostate cancer Father   ? Alcohol abuse Brother   ? Depression Brother   ? Drug abuse Brother   ? Mental illness Brother   ? Cancer Maternal Grandmother   ?     liver  ? Depression Maternal Grandmother   ?  Alcohol abuse Maternal Grandfather   ? Depression Maternal Grandfather   ? Heart disease Maternal Grandfather   ? Hyperlipidemia Maternal Grandfather   ? Hypertension Maternal Grandfather   ? Heart disease Paternal Grandmother   ? Hyperlipidemia Paternal Grandmother   ? Hyperlipidemia Paternal Grandfather   ? ? ?Allergies  ?Allergen Reactions  ? Influenza Vaccines   ?  Other reaction(s): Other ?Guillain-Barre  ? Tetanus Toxoids Other (See Comments)  ?  Can only have the synthetic shot  ? ? ?Current Outpatient Medications on File Prior to Visit  ?Medication Sig Dispense Refill  ? ibuprofen (ADVIL,MOTRIN) 800 MG tablet Take 1 tablet (800 mg total) by mouth every 8 (eight) hours as needed for moderate pain. 15 tablet 0  ? naltrexone (DEPADE) 50 MG tablet Take by mouth.    ? olmesartan (BENICAR) 20 MG  tablet Take 1 tablet (20 mg total) by mouth daily. for blood pressure. 30 tablet 0  ? Varenicline Tartrate, Starter, (CHANTIX STARTING MONTH PAK) 0.5 MG X 11 & 1 MG X 42 TBPK Take one 0.5 mg tablet by mouth once daily for 3 days, then increase to one 0.5 mg tablet twice daily for 4 days, then increase to one 1 mg tablet twice daily. 1 each 0  ? venlafaxine XR (EFFEXOR XR) 37.5 MG 24 hr capsule Take 1 capsule (37.5 mg total) by mouth daily with breakfast. For anxiety and depression. Take with 75 mg dose. 90 capsule 0  ? venlafaxine XR (EFFEXOR XR) 75 MG 24 hr capsule Take 1 capsule (75 mg total) by mouth daily with breakfast. For anxiety and depression. 90 capsule 1  ? ?No current facility-administered medications on file prior to visit.  ? ? ?BP (!) 146/88   Pulse 94   Ht 6' (1.829 m)   Wt 205 lb (93 kg)   SpO2 98%   BMI 27.80 kg/m?  ?Objective:  ? Physical Exam ?Cardiovascular:  ?   Rate and Rhythm: Normal rate and regular rhythm.  ?Pulmonary:  ?   Effort: Pulmonary effort is normal.  ?   Breath sounds: Normal breath sounds. No wheezing or rales.  ?Musculoskeletal:  ?   Cervical back: Neck supple.  ?Skin: ?   General: Skin is warm and dry.  ?Neurological:  ?   Mental Status: He is alert and oriented to person, place, and time.  ? ? ? ? ? ?   ?Assessment & Plan:  ? ? ? ? ?This visit occurred during the SARS-CoV-2 public health emergency.  Safety protocols were in place, including screening questions prior to the visit, additional usage of staff PPE, and extensive cleaning of exam room while observing appropriate contact time as indicated for disinfecting solutions.  ?

## 2021-08-14 NOTE — Assessment & Plan Note (Signed)
Commended him on starting Chantix today.  Recommended he choose a quit date soon. ?

## 2021-08-14 NOTE — Assessment & Plan Note (Signed)
Improved but still above goal. ? ?Commended him on lifestyle changes! ? ?Continue olmesartan 20 mg daily, add amlodipine 5 mg daily.  New prescription sent to pharmacy. ? ?BMP pending today.  We will plan to see him back in 2 to 3 weeks for blood pressure check. ?

## 2021-09-03 ENCOUNTER — Ambulatory Visit (INDEPENDENT_AMBULATORY_CARE_PROVIDER_SITE_OTHER): Payer: 59 | Admitting: Primary Care

## 2021-09-03 ENCOUNTER — Encounter: Payer: Self-pay | Admitting: Primary Care

## 2021-09-03 VITALS — BP 120/72 | HR 82 | Temp 98.6°F | Ht 72.0 in | Wt 204.0 lb

## 2021-09-03 DIAGNOSIS — F17200 Nicotine dependence, unspecified, uncomplicated: Secondary | ICD-10-CM | POA: Diagnosis not present

## 2021-09-03 DIAGNOSIS — I10 Essential (primary) hypertension: Secondary | ICD-10-CM | POA: Diagnosis not present

## 2021-09-03 DIAGNOSIS — Z1211 Encounter for screening for malignant neoplasm of colon: Secondary | ICD-10-CM | POA: Diagnosis not present

## 2021-09-03 MED ORDER — VARENICLINE TARTRATE 1 MG PO TABS: 1.0000 mg | ORAL_TABLET | Freq: Two times a day (BID) | ORAL | 0 refills | Status: DC

## 2021-09-03 MED ORDER — AMLODIPINE-OLMESARTAN 5-20 MG PO TABS: 1.0000 | ORAL_TABLET | Freq: Every day | ORAL | 3 refills | Status: DC

## 2021-09-03 NOTE — Patient Instructions (Signed)
Continue amlodipine-olmesartan 5-20 mg daily for blood pressure.  Continue Chantix 1 mg twice daily for smoking.   You will be contacted regarding your referral to GI for the colonoscopy.  Please let us know if you have not been contacted within two weeks.   It was a pleasure to see you today!

## 2021-09-03 NOTE — Progress Notes (Signed)
Subjective:    Patient ID: Bobby Diaz, male    DOB: Apr 26, 1970, 51 y.o.   MRN: [id number]  HPI  Bobby Diaz is a very pleasant 51 y.o. male with a history of prediabetes, tobacco abuse, hyperlipidemia, hypertension who presents today for follow-up of hypertension and tobacco abuse.   He is ready to schedule his colonoscopy.  1) Hypertension: He was last evaluated on 08/14/2021 for follow-up of hypertension.  During this visit his blood pressure had improved compared to prior visits, but was still above goal despite compliance to olmesartan 20 mg daily.  He had also improved his diet and began exercising.  Given his continued elevated readings we continued his olmesartan 20 mg daily, added amlodipine 5 mg daily, and encouraged continued lifestyle changes.  He is here for follow-up today.  Since his last visit he is compliant to olmesartan 20 mg daily, amlodipine 5 mg daily. He is checking his BP at home which is running 120's-130's/70's. He has continued to work on his diet by limiting sodas and sugary foods.   He denies pedal edema, dizziness, headaches.   2) Tobacco Abuse: He continues to smoke, is down to 1-2 cigarettes daily which is a reduction from 1-1.5 PPD. He is compliant to Chantix, has taken about 3 weeks of the starter pack. He denies SI/HI, other side effects. He is needing a refill. Still some cravings but overall improved.   BP Readings from Last 3 Encounters:  09/03/21 120/72  08/14/21 (!) 146/88  07/29/21 (!) 160/98      Review of Systems  Respiratory:  Negative for shortness of breath.   Cardiovascular:  Negative for chest pain and leg swelling.  Neurological:  Negative for dizziness and headaches.        Past Medical History:  Diagnosis Date   Alcohol abuse    Chickenpox    Depression    GERD (gastroesophageal reflux disease)    Snoring 06/03/2020    Social History   Socioeconomic History   Marital status: Married    Spouse name: Not on  file   Number of children: Not on file   Years of education: Not on file   Highest education level: Not on file  Occupational History   Not on file  Tobacco Use   Smoking status: Every Day    Packs/day: 1.00    Types: Cigarettes   Smokeless tobacco: Never   Tobacco comments:    currently .50 packs per day.  Vaping Use   Vaping Use: Never used  Substance and Sexual Activity   Alcohol use: No   Drug use: Not on file   Sexual activity: Not on file  Other Topics Concern   Not on file  Social History Narrative   Married.   4 children.   Works in [employer].   Enjoys fishing, being outdoors, golfing.   Social Determinants of Health   Financial Resource Strain: Not on file  Food Insecurity: Not on file  Transportation Needs: Not on file  Physical Activity: Not on file  Stress: Not on file  Social Connections: Not on file  Intimate Partner Violence: Not on file    Past Surgical History:  Procedure Laterality Date   TONSILLECTOMY      Family History  Problem Relation Age of Onset   Depression Mother    Mental illness Mother    Prostate cancer Father    Alcohol abuse Brother    Depression Brother    Drug abuse  Brother    Mental illness Brother    Cancer Maternal Grandmother        liver   Depression Maternal Grandmother    Alcohol abuse Maternal Grandfather    Depression Maternal Grandfather    Heart disease Maternal Grandfather    Hyperlipidemia Maternal Grandfather    Hypertension Maternal Grandfather    Heart disease Paternal Grandmother    Hyperlipidemia Paternal Grandmother    Hyperlipidemia Paternal Grandfather     Allergies  Allergen Reactions   Influenza Vaccines     Other reaction(s): Other Guillain-Barre   Tetanus Toxoids Other (See Comments)    Can only have the synthetic shot    Current Outpatient Medications on File Prior to Visit  Medication Sig Dispense Refill   amLODipine-olmesartan (AZOR) 5-20 MG tablet Take 1 tablet by mouth  daily. for blood pressure. 30 tablet 0   ibuprofen (ADVIL,MOTRIN) 800 MG tablet Take 1 tablet (800 mg total) by mouth every 8 (eight) hours as needed for moderate pain. 15 tablet 0   naltrexone (DEPADE) 50 MG tablet Take by mouth.     Varenicline Tartrate, Starter, (CHANTIX STARTING MONTH PAK) 0.5 MG X 11 & 1 MG X 42 TBPK Take one 0.5 mg tablet by mouth once daily for 3 days, then increase to one 0.5 mg tablet twice daily for 4 days, then increase to one 1 mg tablet twice daily. 1 each 0   venlafaxine XR (EFFEXOR XR) 37.5 MG 24 hr capsule Take 1 capsule (37.5 mg total) by mouth daily with breakfast. For anxiety and depression. Take with 75 mg dose. 90 capsule 0   venlafaxine XR (EFFEXOR XR) 75 MG 24 hr capsule Take 1 capsule (75 mg total) by mouth daily with breakfast. For anxiety and depression. 90 capsule 1   No current facility-administered medications on file prior to visit.    BP 120/72   Pulse 82   Temp 98.6 F (37 C) (Oral)   Ht 6' (1.829 m)   Wt 204 lb (92.5 kg)   SpO2 96%   BMI 27.67 kg/m  Objective:   Physical Exam Cardiovascular:     Rate and Rhythm: Normal rate and regular rhythm.  Pulmonary:     Effort: Pulmonary effort is normal.     Breath sounds: Normal breath sounds. No wheezing or rales.  Musculoskeletal:     Cervical back: Neck supple.  Skin:    General: Skin is warm and dry.  Neurological:     Mental Status: He is alert and oriented to person, place, and time.          Assessment & Plan:

## 2021-09-03 NOTE — Assessment & Plan Note (Signed)
Controlled!  Continue amlodipine-olmesartan 5-20 mg daily. BMP reviewed from last visit.  Refills provided.

## 2021-09-03 NOTE — Assessment & Plan Note (Signed)
Improving, encouraged to quit. Refill provided for Chantix.  He continues to decline lung cancer screening.

## 2021-09-04 ENCOUNTER — Telehealth: Payer: Self-pay

## 2021-09-04 NOTE — Telephone Encounter (Signed)
CALLED PATIENT NO ANSWER LEFT VOICEMAIL FOR A CALL BACK ? ?

## 2021-09-08 ENCOUNTER — Telehealth: Payer: Self-pay

## 2021-09-08 NOTE — Telephone Encounter (Signed)
CALLED PATIENT NO ANSWER LEFT VOICEMAIL FOR A CALL BACK °Letter sent °

## 2021-09-10 IMAGING — DX DG FOOT COMPLETE 3+V*R*
3 series · 3 of 3 positions shown · non-contrast
Comparison: None.

CLINICAL DATA: Pt reports fell while working on a garage door and
landed on his right ankle and thinks he shattered it.

EXAM:
RIGHT FOOT COMPLETE - 3+ VIEW

[foot ap]
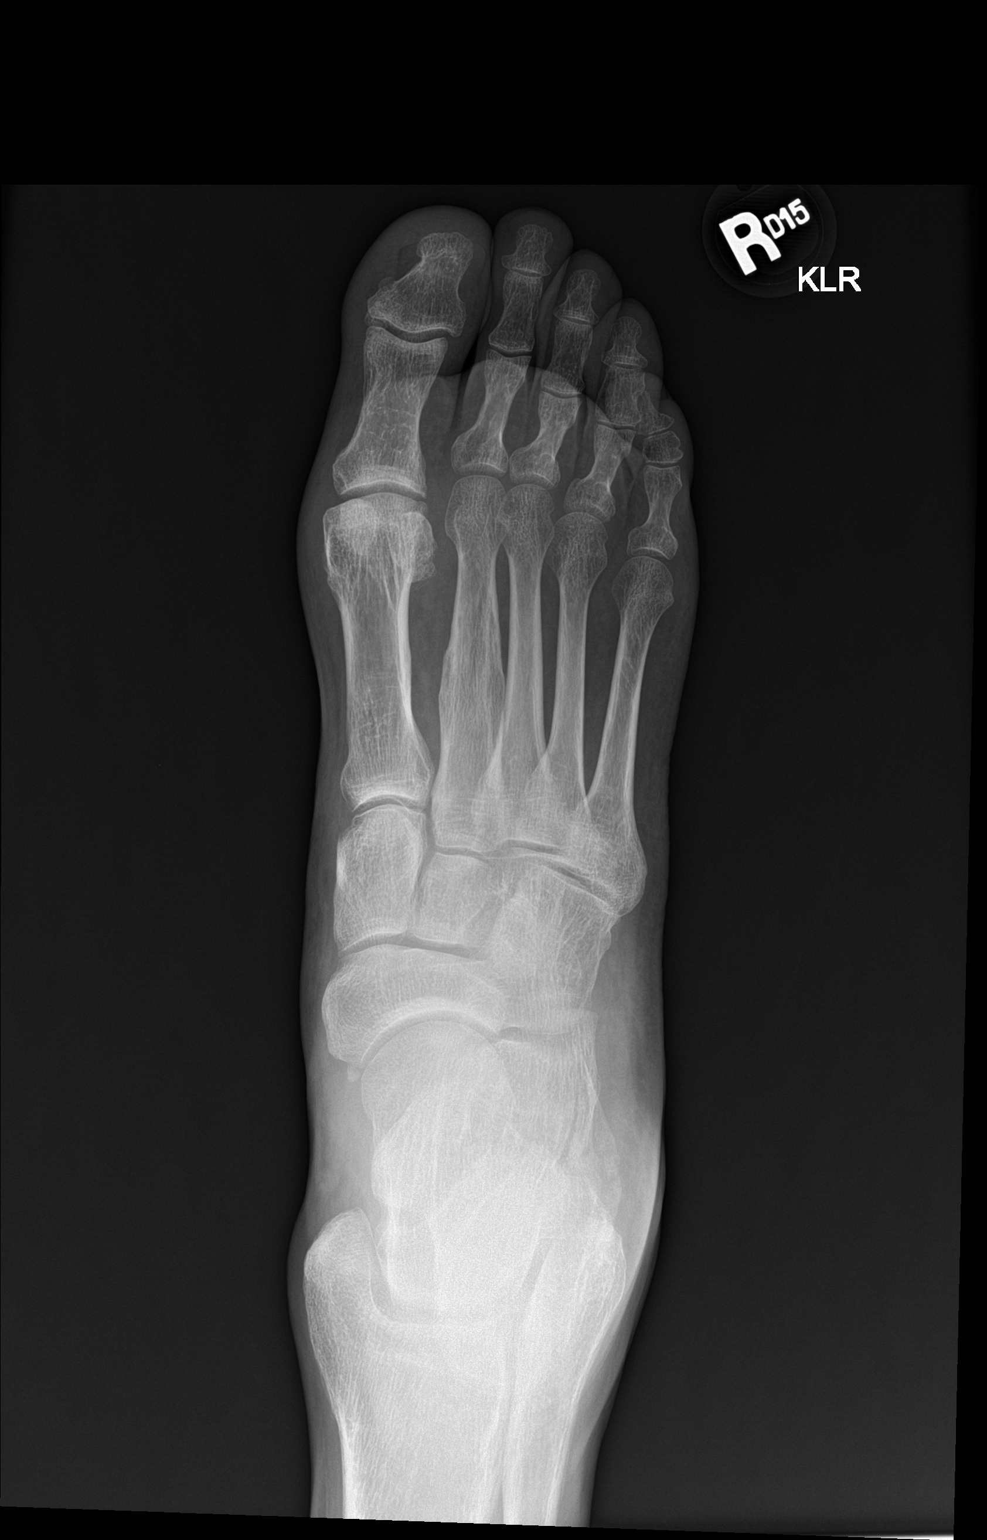

[foot obl]
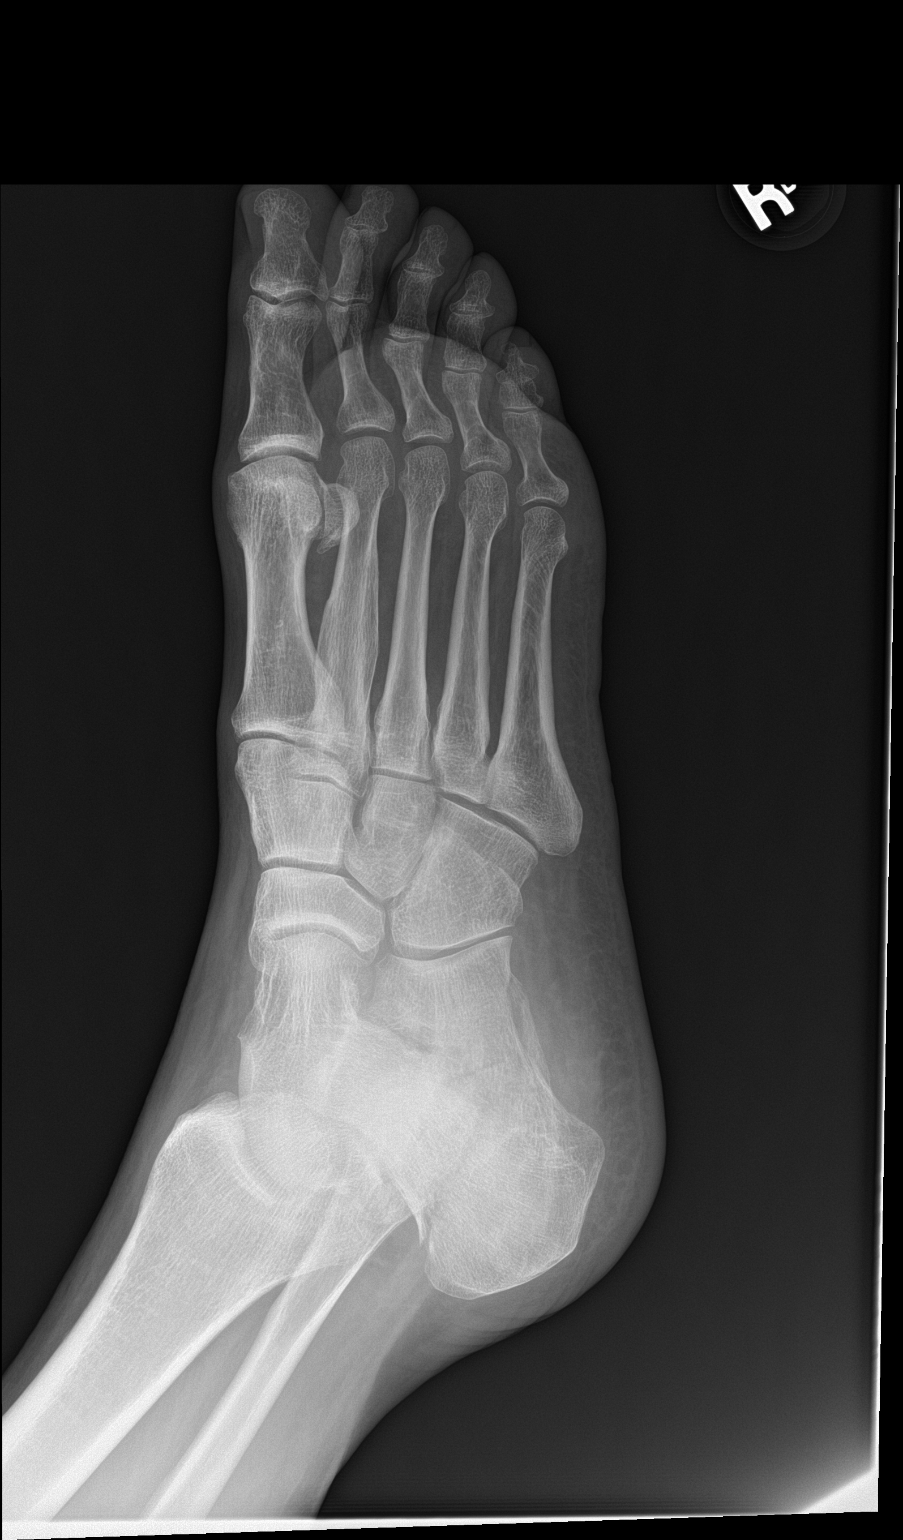

[foot lat]
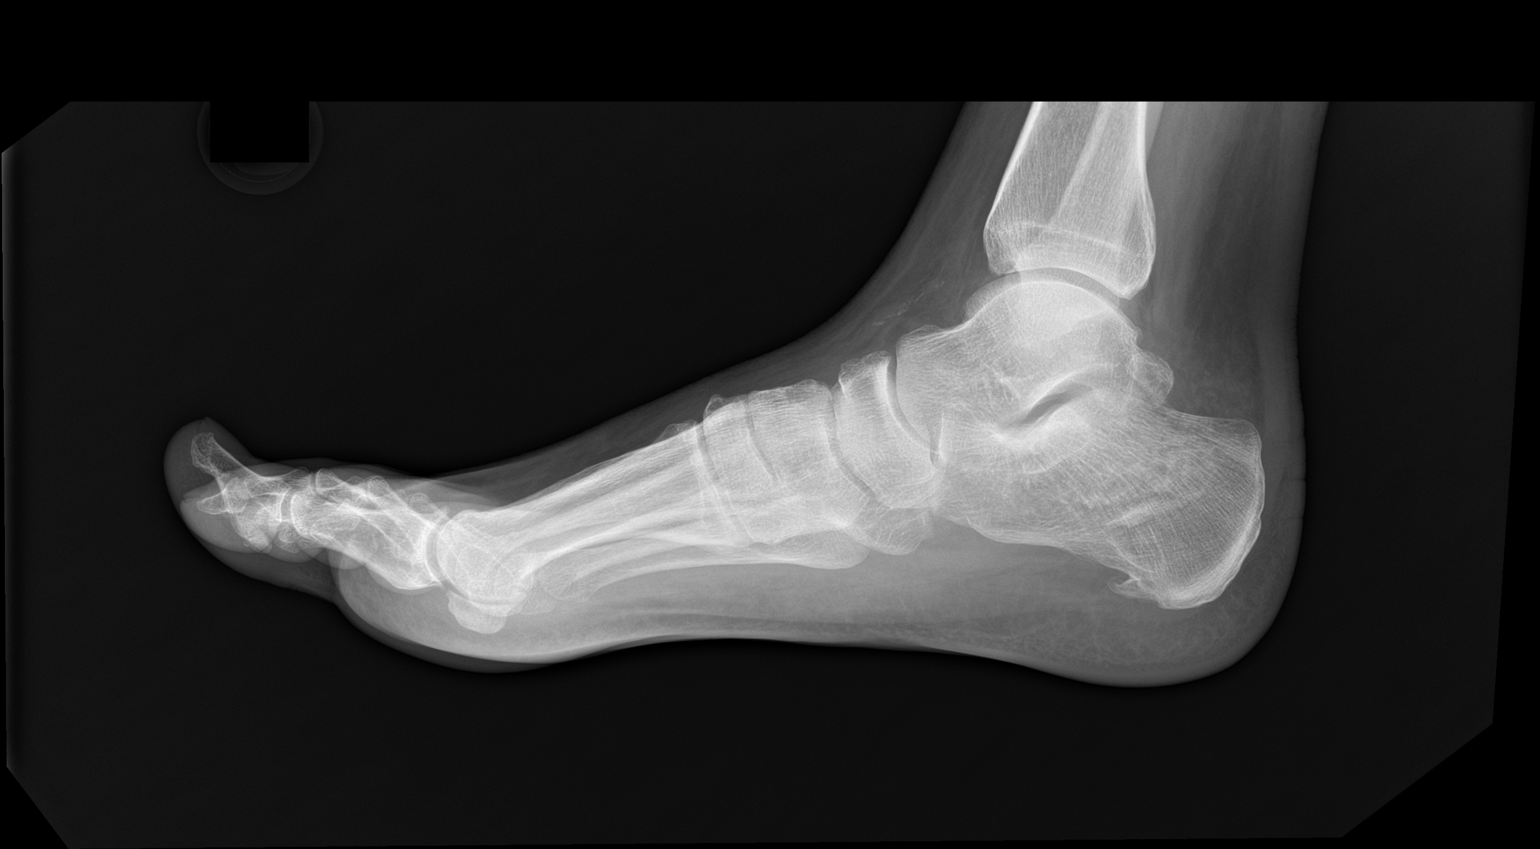

[3 of 3 positions shown; findings below may reference images not displayed]

FINDINGS: There is a mildly displaced fracture through the body of the
calcaneus. No evidence of dislocation. The remaining bones of the
mid and anterior foot appear intact. There is regional soft tissue
swelling.
IMPRESSION: Mildly displaced fracture through the body of the right calcaneus.

## 2021-09-10 IMAGING — CR DG ANKLE COMPLETE 3+V*R*
1 series · 3 of 3 positions shown · non-contrast
Comparison: None

CLINICAL DATA: Fall, RIGHT heel pain. Fall wall working on garage
door.

EXAM:
RIGHT ANKLE - COMPLETE 3+ VIEW

[Series 1: dg ankle complete right · 0.14mm/px · 3 of 3 slices shown]
[im 1/3]
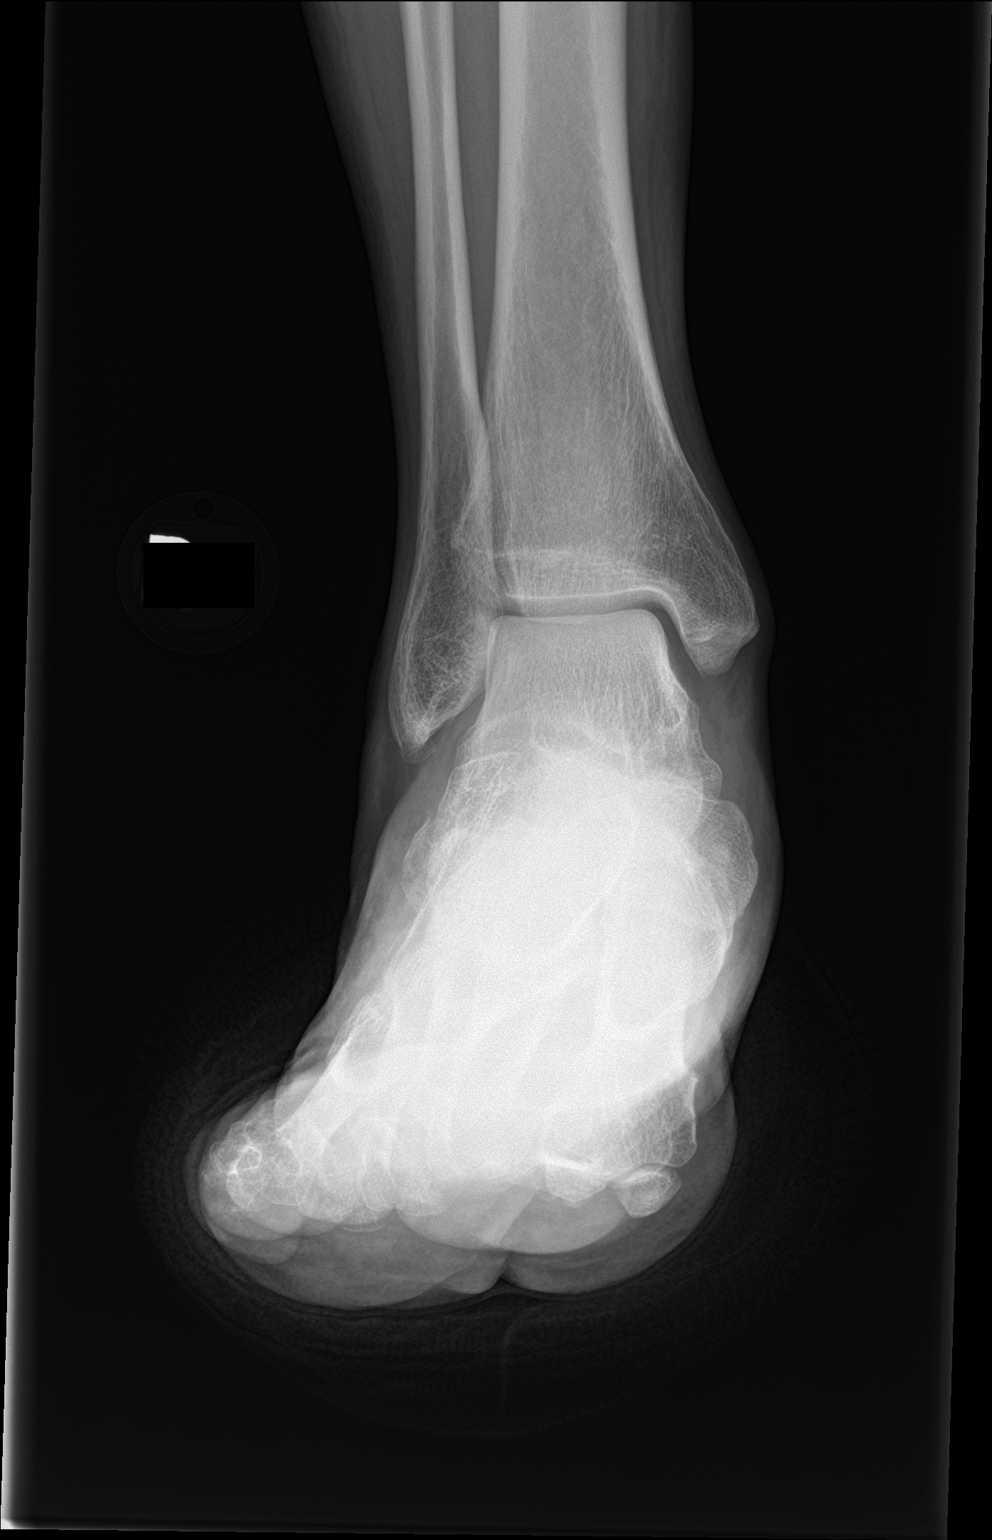
[im 2/3]
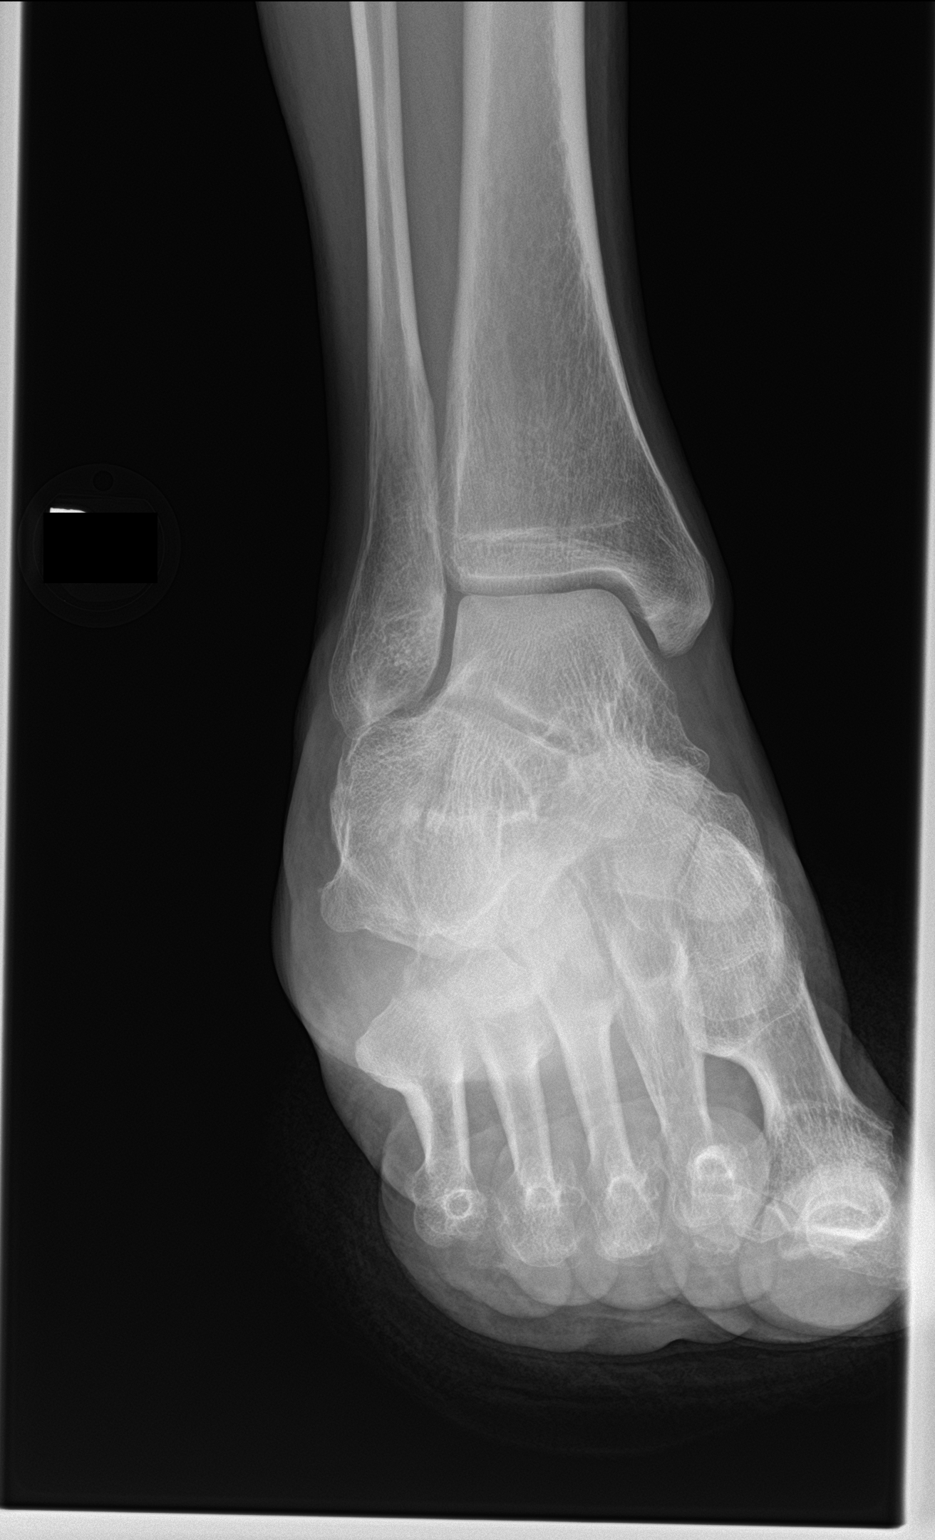
[im 3/3]
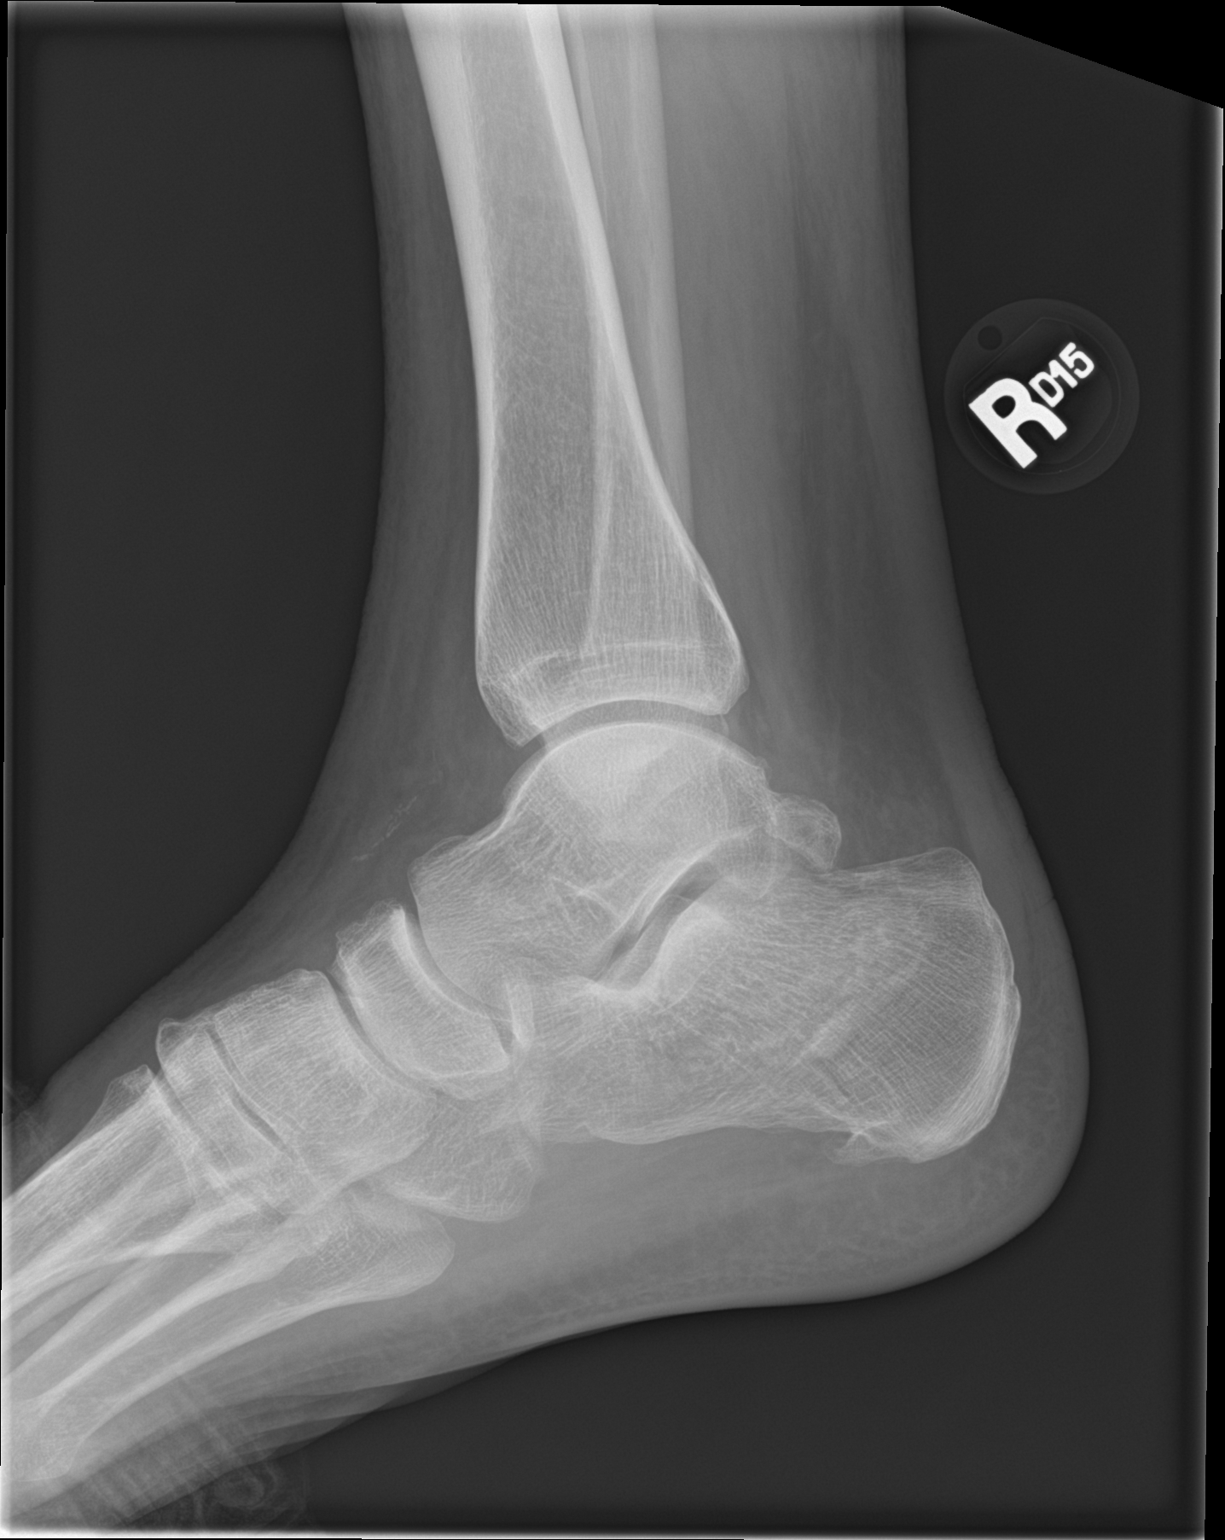

[3 of 3 positions shown; findings below may reference images not displayed]

FINDINGS: Signs of degenerative change about the ankle. Soft tissue swelling
over the lateral ankle.

Ankle mortise is normal. No signs of medial or lateral malleolar
fracture.

Irregularity over the lateral aspect of the ankle could represent
degenerative change within talocalcaneal articulation. Possibility
of a cuboid injury is considered based on the swelling in this
location. Dedicated foot radiographs may be helpful for further
assessment.
IMPRESSION: 1. No signs of medial or lateral malleolar fracture.
2. Irregularity over the lateralw aspect of the ankle could
represent degenerative change within talocalcaneal articulation.
Possibility of a cuboid injury is considered based on the swelling
in this location. Dedicated foot radiographs may be helpful for
further assessment.

## 2021-10-20 ENCOUNTER — Ambulatory Visit (INDEPENDENT_AMBULATORY_CARE_PROVIDER_SITE_OTHER): Payer: 59 | Admitting: Primary Care

## 2021-10-20 VITALS — BP 122/80 | HR 83 | Temp 97.8°F | Ht 72.0 in | Wt 205.1 lb

## 2021-10-20 DIAGNOSIS — F3342 Major depressive disorder, recurrent, in full remission: Secondary | ICD-10-CM

## 2021-10-20 DIAGNOSIS — K219 Gastro-esophageal reflux disease without esophagitis: Secondary | ICD-10-CM

## 2021-10-20 DIAGNOSIS — Z0000 Encounter for general adult medical examination without abnormal findings: Secondary | ICD-10-CM | POA: Diagnosis not present

## 2021-10-20 DIAGNOSIS — G4733 Obstructive sleep apnea (adult) (pediatric): Secondary | ICD-10-CM | POA: Diagnosis not present

## 2021-10-20 DIAGNOSIS — Z0001 Encounter for general adult medical examination with abnormal findings: Secondary | ICD-10-CM | POA: Insufficient documentation

## 2021-10-20 DIAGNOSIS — I10 Essential (primary) hypertension: Secondary | ICD-10-CM

## 2021-10-20 DIAGNOSIS — R7303 Prediabetes: Secondary | ICD-10-CM

## 2021-10-20 DIAGNOSIS — Z8042 Family history of malignant neoplasm of prostate: Secondary | ICD-10-CM

## 2021-10-20 DIAGNOSIS — E785 Hyperlipidemia, unspecified: Secondary | ICD-10-CM

## 2021-10-20 DIAGNOSIS — J019 Acute sinusitis, unspecified: Secondary | ICD-10-CM

## 2021-10-20 DIAGNOSIS — Z23 Encounter for immunization: Secondary | ICD-10-CM | POA: Diagnosis not present

## 2021-10-20 DIAGNOSIS — F419 Anxiety disorder, unspecified: Secondary | ICD-10-CM

## 2021-10-20 LAB — HEMOGLOBIN A1C: Hgb A1c MFr Bld: 6.4 % (ref 4.6–6.5)

## 2021-10-20 LAB — LIPID PANEL
Cholesterol: 158 mg/dL (ref 0–200)
HDL: 42.9 mg/dL (ref >39.00)
LDL Cholesterol: 84 mg/dL (ref 0–99)
NonHDL: 115.4
Total CHOL/HDL Ratio: 4
Triglycerides: 156 mg/dL — ABNORMAL HIGH (ref 0.0–149.0)
VLDL: 31.2 mg/dL (ref 0.0–40.0)

## 2021-10-20 MED ORDER — AMOXICILLIN-POT CLAVULANATE 875-125 MG PO TABS: 1.0000 | ORAL_TABLET | Freq: Two times a day (BID) | ORAL | 0 refills | Status: DC

## 2021-10-20 NOTE — Assessment & Plan Note (Signed)
Uncontrolled last visit. Repeat lipid panel pending.  Commended him on dietary changes.

## 2021-10-20 NOTE — Assessment & Plan Note (Signed)
PSA reviewed from April 2023. Continue annual screening.

## 2021-10-20 NOTE — Assessment & Plan Note (Signed)
Controlled.  Continue venlafaxine ER 112.5 mg daily.

## 2021-10-20 NOTE — Assessment & Plan Note (Signed)
Controlled.  Continue venlafaxine ER 112.5 mg daily. 

## 2021-10-20 NOTE — Patient Instructions (Addendum)
Stop by the lab prior to leaving today. I will notify you of your results once received.   Start Augmentin antibiotics for the infection Take 1 tablet by mouth twice daily for 7 days.  Schedule a nurse for 2-6 months visit to receive your second Shingles vaccine.   It was a pleasure to see you today!  Preventive Care 67-53 Years Old, Male Preventive care refers to lifestyle choices and visits with your health care provider that can promote health and wellness. Preventive care visits are also called wellness exams. What can I expect for my preventive care visit? Counseling During your preventive care visit, your health care provider may ask about your: Medical history, including: Past medical problems. Family medical history. Current health, including: Emotional well-being. Home life and relationship well-being. Sexual activity. Lifestyle, including: Alcohol, nicotine or tobacco, and drug use. Access to firearms. Diet, exercise, and sleep habits. Safety issues such as seatbelt and bike helmet use. Sunscreen use. Work and work Astronomer. Physical exam Your health care provider will check your: Height and weight. These may be used to calculate your BMI (body mass index). BMI is a measurement that tells if you are at a healthy weight. Waist circumference. This measures the distance around your waistline. This measurement also tells if you are at a healthy weight and may help predict your risk of certain diseases, such as type 2 diabetes and high blood pressure. Heart rate and blood pressure. Body temperature. Skin for abnormal spots. What immunizations do I need?  Vaccines are usually given at various ages, according to a schedule. Your health care provider will recommend vaccines for you based on your age, medical history, and lifestyle or other factors, such as travel or where you work. What tests do I need? Screening Your health care provider may recommend screening tests for  certain conditions. This may include: Lipid and cholesterol levels. Diabetes screening. This is done by checking your blood sugar (glucose) after you have not eaten for a while (fasting). Hepatitis B test. Hepatitis C test. HIV (human immunodeficiency virus) test. STI (sexually transmitted infection) testing, if you are at risk. Lung cancer screening. Prostate cancer screening. Colorectal cancer screening. Talk with your health care provider about your test results, treatment options, and if necessary, the need for more tests. Follow these instructions at home: Eating and drinking  Eat a diet that includes fresh fruits and vegetables, whole grains, lean protein, and low-fat dairy products. Take vitamin and mineral supplements as recommended by your health care provider. Do not drink alcohol if your health care provider tells you not to drink. If you drink alcohol: Limit how much you have to 0-2 drinks a day. Know how much alcohol is in your drink. In the [place] one drink equals one 12 oz bottle of beer (355 mL), one 5 oz glass of wine (148 mL), or one 1 oz glass of hard liquor (44 mL). Lifestyle Brush your teeth every morning and night with fluoride toothpaste. Floss one time each day. Exercise for at least 30 minutes 5 or more days each week. Do not use any products that contain nicotine or tobacco. These products include cigarettes, chewing tobacco, and vaping devices, such as e-cigarettes. If you need help quitting, ask your health care provider. Do not use drugs. If you are sexually active, practice safe sex. Use a condom or other form of protection to prevent STIs. Take aspirin only as told by your health care provider. Make sure that you understand how much to  take and what form to take. Work with your health care provider to find out whether it is safe and beneficial for you to take aspirin daily. Find healthy ways to manage stress, such as: Meditation, yoga, or listening to  music. Journaling. Talking to a trusted person. Spending time with friends and family. Minimize exposure to UV radiation to reduce your risk of skin cancer. Safety Always wear your seat belt while driving or riding in a vehicle. Do not drive: If you have been drinking alcohol. Do not ride with someone who has been drinking. When you are tired or distracted. While texting. If you have been using any mind-altering substances or drugs. Wear a helmet and other protective equipment during sports activities. If you have firearms in your house, make sure you follow all gun safety procedures. What's next? Go to your health care provider once a year for an annual wellness visit. Ask your health care provider how often you should have your eyes and teeth checked. Stay up to date on all vaccines. This information is not intended to replace advice given to you by your health care provider. Make sure you discuss any questions you have with your health care provider. Document Revised: 09/24/2020 Document Reviewed: 09/24/2020 Elsevier Patient Education  2023 [employer].

## 2021-10-20 NOTE — Assessment & Plan Note (Signed)
Commended him on dietary changes. Repeat A1C pending.

## 2021-10-20 NOTE — Assessment & Plan Note (Signed)
Suspicious for bacterial involvement.  Start Augmentin BID x 7 days Discussed use of OTC products such as Flonase, antihistamine, etc.

## 2021-10-20 NOTE — Assessment & Plan Note (Signed)
Compliant to CPAP machine, continue same.  

## 2021-10-20 NOTE — Assessment & Plan Note (Signed)
Controlled.  No longer on PC powder. Not on medications.

## 2021-10-20 NOTE — Assessment & Plan Note (Signed)
Controlled.  Continue amlodipine-olmesartan 5-20 mg daily CMP reviewed.

## 2021-10-20 NOTE — Progress Notes (Signed)
Subjective:    Patient ID: Bobby Diaz, male    DOB: Jun 30, 1970, 51 y.o.   MRN: [id number]  HPI  Bobby Diaz is a very pleasant 51 y.o. male who presents today for complete physical and follow up of chronic conditions.  He would also like to mention sinus pressure. Acute onset 2-3 weeks ago. Symptoms include bilateral maxillary sinus pressure, nasal congestion, frontal headache. Last week he developed dark, green mucous from his nasal cavity. Also with some fatigue.   Immunizations: -Tetanus: Allergy -Influenza: Did not complete last season  -Covid-19: 1 vaccine -Shingles: Never completed   Diet: healthy diet.  Exercise: No regular exercise.  Eye exam: Completes annually  Dental exam: Completes semi-annually   Colonoscopy: Never Completed, scheduled  Lung Cancer Screening: Never completed, declines today  PSA: Due  BP Readings from Last 3 Encounters:  10/20/21 122/80  09/03/21 120/72  08/14/21 (!) 146/88    Wt Readings from Last 3 Encounters:  10/20/21 205 lb 2 oz (93 kg)  09/03/21 204 lb (92.5 kg)  08/14/21 205 lb (93 kg)     Review of Systems  Constitutional:  Positive for fatigue. Negative for unexpected weight change.  HENT:  Positive for congestion, rhinorrhea, sinus pressure and sinus pain. Negative for sore throat.   Respiratory:  Negative for cough and shortness of breath.   Cardiovascular:  Negative for chest pain.  Gastrointestinal:  Negative for constipation and diarrhea.  Genitourinary:  Negative for difficulty urinating.  Musculoskeletal:  Negative for arthralgias and myalgias.  Skin:  Negative for rash.  Allergic/Immunologic: Negative for environmental allergies.  Neurological:  Positive for headaches. Negative for dizziness.  Psychiatric/Behavioral:  The patient is not nervous/anxious.          Past Medical History:  Diagnosis Date   Alcohol abuse    Chickenpox    Depression    GERD (gastroesophageal reflux disease)     Snoring 06/03/2020    Social History   Socioeconomic History   Marital status: Married    Spouse name: Not on file   Number of children: Not on file   Years of education: Not on file   Highest education level: Not on file  Occupational History   Not on file  Tobacco Use   Smoking status: Every Day    Packs/day: 1.00    Types: Cigarettes   Smokeless tobacco: Never   Tobacco comments:    currently .50 packs per day.  Vaping Use   Vaping Use: Never used  Substance and Sexual Activity   Alcohol use: No   Drug use: Not on file   Sexual activity: Not on file  Other Topics Concern   Not on file  Social History Narrative   Married.   4 children.   Works in [employer].   Enjoys fishing, being outdoors, golfing.   Social Determinants of Health   Financial Resource Strain: Not on file  Food Insecurity: Not on file  Transportation Needs: Not on file  Physical Activity: Not on file  Stress: Not on file  Social Connections: Not on file  Intimate Partner Violence: Not on file    Past Surgical History:  Procedure Laterality Date   TONSILLECTOMY      Family History  Problem Relation Age of Onset   Depression Mother    Mental illness Mother    Prostate cancer Father    Alcohol abuse Brother    Depression Brother    Drug abuse Brother  Mental illness Brother    Cancer Maternal Grandmother        liver   Depression Maternal Grandmother    Alcohol abuse Maternal Grandfather    Depression Maternal Grandfather    Heart disease Maternal Grandfather    Hyperlipidemia Maternal Grandfather    Hypertension Maternal Grandfather    Heart disease Paternal Grandmother    Hyperlipidemia Paternal Grandmother    Hyperlipidemia Paternal Grandfather     Allergies  Allergen Reactions   Influenza Vaccines     Other reaction(s): Other Guillain-Barre   Tetanus Toxoids Other (See Comments)    Can only have the synthetic shot    Current Outpatient Medications on File Prior  to Visit  Medication Sig Dispense Refill   amLODipine-olmesartan (AZOR) 5-20 MG tablet Take 1 tablet by mouth daily. for blood pressure. 90 tablet 3   ibuprofen (ADVIL,MOTRIN) 800 MG tablet Take 1 tablet (800 mg total) by mouth every 8 (eight) hours as needed for moderate pain. 15 tablet 0   naltrexone (DEPADE) 50 MG tablet Take by mouth.     venlafaxine XR (EFFEXOR XR) 37.5 MG 24 hr capsule Take 1 capsule (37.5 mg total) by mouth daily with breakfast. For anxiety and depression. Take with 75 mg dose. 90 capsule 0   venlafaxine XR (EFFEXOR XR) 75 MG 24 hr capsule Take 1 capsule (75 mg total) by mouth daily with breakfast. For anxiety and depression. 90 capsule 1   No current facility-administered medications on file prior to visit.    BP 122/80   Pulse 83   Temp 97.8 F (36.6 C) (Temporal)   Ht 6' (1.829 m)   Wt 205 lb 2 oz (93 kg)   SpO2 97%   BMI 27.82 kg/m  Objective:   Physical Exam HENT:     Right Ear: Tympanic membrane and ear canal normal.     Left Ear: Tympanic membrane and ear canal normal.     Nose: Nose normal.     Right Sinus: No maxillary sinus tenderness or frontal sinus tenderness.     Left Sinus: No maxillary sinus tenderness or frontal sinus tenderness.  Eyes:     Conjunctiva/sclera: Conjunctivae normal.  Neck:     Thyroid: No thyromegaly.     Vascular: No carotid bruit.  Cardiovascular:     Rate and Rhythm: Normal rate and regular rhythm.     Heart sounds: Normal heart sounds.  Pulmonary:     Effort: Pulmonary effort is normal.     Breath sounds: Normal breath sounds. No wheezing or rales.  Abdominal:     General: Bowel sounds are normal.     Palpations: Abdomen is soft.     Tenderness: There is no abdominal tenderness.  Musculoskeletal:        General: Normal range of motion.     Cervical back: Neck supple.  Skin:    General: Skin is warm and dry.  Neurological:     Mental Status: He is alert and oriented to person, place, and time.     Cranial  Nerves: No cranial nerve deficit.     Deep Tendon Reflexes: Reflexes are normal and symmetric.  Psychiatric:        Mood and Affect: Mood normal.           Assessment & Plan:   Problem List Items Addressed This Visit       Cardiovascular and Mediastinum   Essential hypertension    Controlled.  Continue amlodipine-olmesartan 5-20 mg daily CMP  reviewed.         Respiratory   OSA (obstructive sleep apnea)    Compliant to CPAP machine, continue same.       Acute sinusitis    Suspicious for bacterial involvement.  Start Augmentin BID x 7 days Discussed use of OTC products such as Flonase, antihistamine, etc.        Relevant Medications   amoxicillin-clavulanate (AUGMENTIN) 875-125 MG tablet     Digestive   GERD (gastroesophageal reflux disease)    Controlled.  No longer on PC powder. Not on medications.         Other   Major depression    Controlled.  Continue venlafaxine ER 112.5 mg daily.      Family history of prostate cancer in father    PSA reviewed from April 2023. Continue annual screening.      Hyperlipidemia    Uncontrolled last visit. Repeat lipid panel pending.  Commended him on dietary changes.       Relevant Orders   Lipid panel   Anxiety    Controlled.  Continue venlafaxine ER 112.5 mg daily.      Prediabetes    Commended him on dietary changes. Repeat A1C pending.      Relevant Orders   Hemoglobin A1c   Encounter for annual general medical examination with abnormal findings in adult - Primary    First Shingrix vaccine provided today. PSA UTD. Colonoscopy scheduled. Declines lung cancer screening.  Commended him on dietary changes.  Exam today as noted. Labs reviewed and pending.          Doreene Nest, NP

## 2021-10-20 NOTE — Assessment & Plan Note (Signed)
First Shingrix vaccine provided today. PSA UTD. Colonoscopy scheduled. Declines lung cancer screening.  Commended him on dietary changes.  Exam today as noted. Labs reviewed and pending.

## 2021-11-02 DIAGNOSIS — E785 Hyperlipidemia, unspecified: Secondary | ICD-10-CM

## 2021-11-02 DIAGNOSIS — F32A Depression, unspecified: Secondary | ICD-10-CM

## 2021-11-02 MED ORDER — VENLAFAXINE HCL ER 37.5 MG PO CP24: 37.5000 mg | ORAL_CAPSULE | Freq: Every day | ORAL | 3 refills | Status: DC

## 2021-11-02 MED ORDER — VENLAFAXINE HCL ER 75 MG PO CP24: 75.0000 mg | ORAL_CAPSULE | Freq: Every day | ORAL | 3 refills | Status: DC

## 2021-11-02 NOTE — Telephone Encounter (Signed)
Last OV: 10/20/21 for CPE  I don't see any cholesterol medication on pt's med list to pend for refill.

## 2021-11-03 MED ORDER — ROSUVASTATIN CALCIUM 5 MG PO TABS: 5.0000 mg | ORAL_TABLET | Freq: Every day | ORAL | 3 refills | Status: DC

## 2021-12-04 DIAGNOSIS — F17200 Nicotine dependence, unspecified, uncomplicated: Secondary | ICD-10-CM

## 2021-12-06 MED ORDER — CHANTIX STARTING MONTH PAK 0.5 MG X 11 & 1 MG X 42 PO TBPK: ORAL_TABLET | ORAL | 0 refills | Status: DC

## 2022-02-25 ENCOUNTER — Ambulatory Visit (INDEPENDENT_AMBULATORY_CARE_PROVIDER_SITE_OTHER): Payer: 59

## 2022-02-25 DIAGNOSIS — Z23 Encounter for immunization: Secondary | ICD-10-CM

## 2022-03-19 ENCOUNTER — Ambulatory Visit (INDEPENDENT_AMBULATORY_CARE_PROVIDER_SITE_OTHER): Payer: 59 | Admitting: Primary Care

## 2022-03-19 ENCOUNTER — Encounter: Payer: Self-pay | Admitting: Primary Care

## 2022-03-19 VITALS — BP 150/84 | HR 70 | Temp 98.3°F | Ht 72.0 in | Wt 207.0 lb

## 2022-03-19 DIAGNOSIS — F419 Anxiety disorder, unspecified: Secondary | ICD-10-CM

## 2022-03-19 DIAGNOSIS — F32A Depression, unspecified: Secondary | ICD-10-CM

## 2022-03-19 MED ORDER — ESCITALOPRAM OXALATE 10 MG PO TABS: 10.0000 mg | ORAL_TABLET | Freq: Every day | ORAL | 0 refills | Status: DC

## 2022-03-19 NOTE — Progress Notes (Signed)
Subjective:    Patient ID: Bobby Diaz, male    DOB: 10/11/1970, 51 y.o.   MRN: [id number]  Anxiety Symptoms include nervous/anxious behavior. Patient reports no chest pain, shortness of breath or suicidal ideas.      Caedmon Novakovic is a very pleasant 50 y.o. male with a history of hypertension, OSA, MDD, Anxiety, panic attack who presents today to discuss anxiety.  Symptoms include mood swings, increased irritability, little patience, increased worrying, little interest in doing things, feeling down. This began several months ago.   He's been under a lot of stress in his personal life, has had a lot of unexpected expenses which have been tough to handle.   Currently managed on venlafaxine ER 112.5 mg daily but he doesn't feel that this is effective any longer. Previously managed on Fluoxetine for years which was effective but eventually effects wore off.   He denies SI/HI.        03/19/2022   11:36 AM 12/29/2017    9:27 AM  GAD 7 : Generalized Anxiety Score  Nervous, Anxious, on Edge 3 1  Control/stop worrying 2 0  Worry too much - different things 2 3  Trouble relaxing 3 1  Restless 3 1  Easily annoyed or irritable 3 3  Afraid - awful might happen 0 1  Total GAD 7 Score 16 10  Anxiety Difficulty Extremely difficult Somewhat difficult         Review of Systems  Respiratory:  Negative for shortness of breath.   Cardiovascular:  Negative for chest pain.  Psychiatric/Behavioral:  Negative for suicidal ideas. The patient is nervous/anxious.        See HPI         Past Medical History:  Diagnosis Date   Alcohol abuse    Chickenpox    Depression    GERD (gastroesophageal reflux disease)    Snoring 06/03/2020    Social History   Socioeconomic History   Marital status: Married    Spouse name: Not on file   Number of children: Not on file   Years of education: Not on file   Highest education level: Not on file  Occupational History   Not on  file  Tobacco Use   Smoking status: Every Day    Packs/day: 1.00    Types: Cigarettes   Smokeless tobacco: Never   Tobacco comments:    currently .50 packs per day.  Vaping Use   Vaping Use: Never used  Substance and Sexual Activity   Alcohol use: No   Drug use: Not on file   Sexual activity: Not on file  Other Topics Concern   Not on file  Social History Narrative   Married.   4 children.   Works in [employer].   Enjoys fishing, being outdoors, golfing.   Social Determinants of Health   Financial Resource Strain: Not on file  Food Insecurity: Not on file  Transportation Needs: Not on file  Physical Activity: Not on file  Stress: Not on file  Social Connections: Not on file  Intimate Partner Violence: Not on file    Past Surgical History:  Procedure Laterality Date   TONSILLECTOMY      Family History  Problem Relation Age of Onset   Depression Mother    Mental illness Mother    Prostate cancer Father    Alcohol abuse Brother    Depression Brother    Drug abuse Brother    Mental illness Brother  Cancer Maternal Grandmother        liver   Depression Maternal Grandmother    Alcohol abuse Maternal Grandfather    Depression Maternal Grandfather    Heart disease Maternal Grandfather    Hyperlipidemia Maternal Grandfather    Hypertension Maternal Grandfather    Heart disease Paternal Grandmother    Hyperlipidemia Paternal Grandmother    Hyperlipidemia Paternal Grandfather     Allergies  Allergen Reactions   Influenza Vaccines     Other reaction(s): Other Guillain-Barre   Tetanus Toxoids Other (See Comments)    Can only have the synthetic shot    Current Outpatient Medications on File Prior to Visit  Medication Sig Dispense Refill   amLODipine-olmesartan (AZOR) 5-20 MG tablet Take 1 tablet by mouth daily. for blood pressure. 90 tablet 3   ibuprofen (ADVIL,MOTRIN) 800 MG tablet Take 1 tablet (800 mg total) by mouth every 8 (eight) hours as needed  for moderate pain. 15 tablet 0   rosuvastatin (CRESTOR) 5 MG tablet Take 1 tablet (5 mg total) by mouth daily. for cholesterol. 90 tablet 3   No current facility-administered medications on file prior to visit.    BP (!) 150/84   Pulse 70   Temp 98.3 F (36.8 C) (Temporal)   Ht 6' (1.829 m)   Wt 207 lb (93.9 kg)   SpO2 98%   BMI 28.07 kg/m  Objective:   Physical Exam Cardiovascular:     Rate and Rhythm: Normal rate and regular rhythm.  Pulmonary:     Effort: Pulmonary effort is normal.     Breath sounds: Normal breath sounds. No wheezing or rales.  Musculoskeletal:     Cervical back: Neck supple.  Skin:    General: Skin is warm and dry.  Neurological:     Mental Status: He is alert and oriented to person, place, and time.  Psychiatric:        Mood and Affect: Mood normal.           Assessment & Plan:   Problem List Items Addressed This Visit       Other   Anxiety and depression - Primary    Uncontrolled.  Wean off venlafaxine ER but stopping 75 mg dose now. In one week stop 37.5 mg.  Meanwhile, start Lexapro 10 mg now.   He will update in 4 weeks.       Relevant Medications   escitalopram (LEXAPRO) 10 MG tablet       Doreene Nest, NP

## 2022-03-19 NOTE — Assessment & Plan Note (Signed)
Uncontrolled.  Wean off venlafaxine ER but stopping 75 mg dose now. In one week stop 37.5 mg.  Meanwhile, start Lexapro 10 mg now.   He will update in 4 weeks.

## 2022-03-19 NOTE — Patient Instructions (Signed)
Start Lexapro 10 mg daily for anxiety/depression.  Stop your venlafaxine ER 75 mg now. Take only 37.5 mg. In one week, stop your venlafaxine ER 37.5 mg.   Please update me in about 4 weeks.   It was a pleasure to see you today!

## 2022-03-31 DIAGNOSIS — F419 Anxiety disorder, unspecified: Secondary | ICD-10-CM

## 2022-04-05 MED ORDER — DULOXETINE HCL 30 MG PO CPEP: 30.0000 mg | ORAL_CAPSULE | Freq: Every day | ORAL | 0 refills | Status: DC

## 2022-06-09 ENCOUNTER — Other Ambulatory Visit: Payer: Self-pay | Admitting: Primary Care

## 2022-06-09 DIAGNOSIS — F32A Depression, unspecified: Secondary | ICD-10-CM

## 2022-06-27 ENCOUNTER — Other Ambulatory Visit: Payer: Self-pay | Admitting: Primary Care

## 2022-06-27 DIAGNOSIS — F419 Anxiety disorder, unspecified: Secondary | ICD-10-CM

## 2022-06-29 ENCOUNTER — Encounter: Payer: Self-pay | Admitting: Primary Care

## 2022-06-29 ENCOUNTER — Ambulatory Visit (INDEPENDENT_AMBULATORY_CARE_PROVIDER_SITE_OTHER): Payer: 59 | Admitting: Primary Care

## 2022-06-29 VITALS — BP 122/80 | HR 82 | Temp 97.2°F | Ht 72.0 in | Wt 214.0 lb

## 2022-06-29 DIAGNOSIS — F32A Depression, unspecified: Secondary | ICD-10-CM | POA: Diagnosis not present

## 2022-06-29 DIAGNOSIS — F419 Anxiety disorder, unspecified: Secondary | ICD-10-CM

## 2022-06-29 MED ORDER — DULOXETINE HCL 60 MG PO CPEP: 60.0000 mg | ORAL_CAPSULE | Freq: Every day | ORAL | 0 refills | Status: DC

## 2022-06-29 NOTE — Progress Notes (Signed)
Subjective:    Patient ID: Bobby Diaz, male    DOB: 03/02/1971, 52 y.o.   MRN: [id number]  HPI  Bobby Diaz is a very pleasant 52 y.o. male with a history of hypertension, MDD, GBS, nicotine dependence, hyperlipidemia who presents today for follow up of depression.   He was last evaluated in December 2023 for symptoms of mood swings, irritability, worrying, little interest in doing things despite management on venlafaxine ER 112.5 mg daily. Given his lack of improvement on venlafaxine at the increased dose we weaned him off and initiated Lexapro 10 mg.   Since his last visit he sent a message via [employer] in late December without noticing any improvement on Lexapro 10 mg so we discontinued Lexapro and initiated Cymbalta.   Today he has been compliant to his Cymbalta 30 mg daily. He continues to notice "good and bad days". More good than bad days. Positive effects include less worrying and is no longer feeling down. His motivation has improved. During "bad days" he experiences symptoms of increased irritability, mood swings. His family has noticed these mood swings. He admits to increased stress at work and has low tolerance for the behaviors he sees at work.      06/29/2022    8:31 AM 03/19/2022   11:36 AM 08/14/2021   11:56 AM  PHQ9 SCORE ONLY  PHQ-9 Total Score 1 6 0      06/29/2022    8:31 AM 03/19/2022   11:36 AM 12/29/2017    9:27 AM  GAD 7 : Generalized Anxiety Score  Nervous, Anxious, on Edge 1 3 1   Control/stop worrying 0 2 0  Worry too much - different things 0 2 3  Trouble relaxing 0 3 1  Restless 0 3 1  Easily annoyed or irritable 2 3 3   Afraid - awful might happen 0 0 1  Total GAD 7 Score 3 16 10   Anxiety Difficulty Not difficult at all Extremely difficult Somewhat difficult      BP Readings from Last 3 Encounters:  06/29/22 122/80  03/19/22 (!) 150/84  10/20/21 122/80      Review of Systems  Gastrointestinal:  Negative for abdominal pain and  diarrhea.  Neurological:  Negative for headaches.  Psychiatric/Behavioral:  Negative for sleep disturbance. The patient is nervous/anxious.          Past Medical History:  Diagnosis Date   Alcohol abuse    Chickenpox    Depression    GERD (gastroesophageal reflux disease)    Snoring 06/03/2020    Social History   Socioeconomic History   Marital status: Married    Spouse name: Not on file   Number of children: Not on file   Years of education: Not on file   Highest education level: Not on file  Occupational History   Not on file  Tobacco Use   Smoking status: Former    Packs/day: 1    Types: Cigarettes   Smokeless tobacco: Never  Vaping Use   Vaping Use: Never used  Substance and Sexual Activity   Alcohol use: No   Drug use: Not on file   Sexual activity: Not on file  Other Topics Concern   Not on file  Social History Narrative   Married.   4 children.   Works in [employer].   Enjoys fishing, being outdoors, golfing.   Social Determinants of Health   Financial Resource Strain: Not on file  Food Insecurity: Not on file  Transportation Needs: Not on file  Physical Activity: Not on file  Stress: Not on file  Social Connections: Not on file  Intimate Partner Violence: Not on file    Past Surgical History:  Procedure Laterality Date   TONSILLECTOMY      Family History  Problem Relation Age of Onset   Depression Mother    Mental illness Mother    Prostate cancer Father    Alcohol abuse Brother    Depression Brother    Drug abuse Brother    Mental illness Brother    Cancer Maternal Grandmother        liver   Depression Maternal Grandmother    Alcohol abuse Maternal Grandfather    Depression Maternal Grandfather    Heart disease Maternal Grandfather    Hyperlipidemia Maternal Grandfather    Hypertension Maternal Grandfather    Heart disease Paternal Grandmother    Hyperlipidemia Paternal Grandmother    Hyperlipidemia Paternal Grandfather      Allergies  Allergen Reactions   Influenza Vaccines     Other reaction(s): Other Guillain-Barre   Tetanus Toxoids Other (See Comments)    Can only have the synthetic shot    Current Outpatient Medications on File Prior to Visit  Medication Sig Dispense Refill   amLODipine-olmesartan (AZOR) 5-20 MG tablet Take 1 tablet by mouth daily. for blood pressure. 90 tablet 3   ibuprofen (ADVIL,MOTRIN) 800 MG tablet Take 1 tablet (800 mg total) by mouth every 8 (eight) hours as needed for moderate pain. 15 tablet 0   rosuvastatin (CRESTOR) 5 MG tablet Take 1 tablet (5 mg total) by mouth daily. for cholesterol. 90 tablet 3   No current facility-administered medications on file prior to visit.    BP 122/80   Pulse 82   Temp (!) 97.2 F (36.2 C) (Temporal)   Ht 6' (1.829 m)   Wt 214 lb (97.1 kg)   SpO2 95%   BMI 29.02 kg/m  Objective:   Physical Exam Cardiovascular:     Rate and Rhythm: Normal rate and regular rhythm.  Pulmonary:     Effort: Pulmonary effort is normal.     Breath sounds: Normal breath sounds. No wheezing or rales.  Musculoskeletal:     Cervical back: Neck supple.  Skin:    General: Skin is warm and dry.  Neurological:     Mental Status: He is alert and oriented to person, place, and time.           Assessment & Plan:  Anxiety and depression Assessment & Plan: Improved but not at goal.    Increase Cymbalta to 60 mg daily. He agrees.   He will update via MyChart in 4 weeks.   I evaluated patient, was consulted regarding treatment, and agree with assessment and plan per Tinnie Gens, RN, DNP student.   Allie Bossier, NP-C   Orders: -     DULoxetine HCl; Take 1 capsule (60 mg total) by mouth daily. For anxiety and depression  Dispense: 90 capsule; Refill: 0        Pleas Koch, NP

## 2022-06-29 NOTE — Assessment & Plan Note (Addendum)
Improved but not at goal.    Increase Cymbalta to 60 mg daily. He agrees.   He will update via MyChart in 4 weeks.   I evaluated patient, was consulted regarding treatment, and agree with assessment and plan per Tinnie Gens, RN, DNP student.   Allie Bossier, NP-C

## 2022-06-29 NOTE — Patient Instructions (Addendum)
Start cymbalta 60 mg daily.   Please update in four weeks.   Physical is due in July.  It was a pleasure to see you today!

## 2022-06-29 NOTE — Progress Notes (Signed)
Established Patient Office Visit  Subjective   Patient ID: Bobby Diaz, male    DOB: 1970/10/19  Age: 52 y.o. MRN: [id number]  Chief Complaint  Patient presents with   Medical Management of Chronic Issues    3 mo anxiety and depression f/u    HPI  Bobby Diaz is a 52 year old male with past medical history of hypertension, OSA, anxiety and depression and hyperlipidemia presents today for a follow up on anxiety and depression.   He was evaluated on March 19, 2022 and was started on Lexapro 10 mg. He sent a mychart message on 03/31/22 that he had seen no improvement on the Lexapro. He agreed to stop Lexapro and start Duloxetine 30 mg once a day.   He has been taking Cymbalta 30 mg daily since end of December without missing any doses. He has seen some improvement with less worrying and mind racing thoughts.  He reports that he has 4-5 good days in a typical week and then 2-3 bad days. However, he is still having some irritability, increased heart rate with anxiety and being more snappy. His wife has told him that he has been having mood swings.  His work schedule is not consistent and which does affect his sleep, however this has been the same for many years. He does use a CPAP machine which has been helping. He denies any SI/HI. He is interested in getting serotonin, dopamine levels checked.      06/29/2022    8:31 AM 03/19/2022   11:36 AM 08/14/2021   11:56 AM 06/03/2020    8:08 AM 11/15/2016   10:45 AM  Depression screen PHQ 2/9  Decreased Interest 0 1 0 0 0  Down, Depressed, Hopeless 0 1 0 0 0  PHQ - 2 Score 0 2 0 0 0  Altered sleeping 0 2  3   Tired, decreased energy 1 2  0   Change in appetite 0 0  0   Feeling bad or failure about yourself  0 0  0   Trouble concentrating 0 0  0   Moving slowly or fidgety/restless 0 0  0   Suicidal thoughts 0 0  0   PHQ-9 Score 1 6  3    Difficult doing work/chores Not difficult at all Somewhat difficult  Not difficult at all         06/29/2022    8:31 AM 03/19/2022   11:36 AM 12/29/2017    9:27 AM  GAD 7 : Generalized Anxiety Score  Nervous, Anxious, on Edge 1 3 1   Control/stop worrying 0 2 0  Worry too much - different things 0 2 3  Trouble relaxing 0 3 1  Restless 0 3 1  Easily annoyed or irritable 2 3 3   Afraid - awful might happen 0 0 1  Total GAD 7 Score 3 16 10   Anxiety Difficulty Not difficult at all Extremely difficult Somewhat difficult         Patient Active Problem List   Diagnosis Date Noted   Acute sinusitis 10/20/2021   Encounter for annual general medical examination with abnormal findings in adult 10/20/2021   Essential hypertension 07/29/2021   Nicotine dependence with current use 07/29/2021   Prediabetes 07/29/2021   OSA (obstructive sleep apnea) 08/15/2020   Anxiety and depression 07/31/2020   Renal calculi 07/31/2020   Family history of prostate cancer in father 06/03/2020   Hyperlipidemia 06/03/2020   Shifting sleep-work schedule, affecting sleep 02/23/2019  Erectile dysfunction 02/13/2018   GERD (gastroesophageal reflux disease) 11/15/2016   Major depression 06/20/2013   Panic attack 06/20/2013   Guillain-Barre syndrome following vaccination ([place]) 06/19/2013   Past Medical History:  Diagnosis Date   Alcohol abuse    Chickenpox    Depression    GERD (gastroesophageal reflux disease)    Snoring 06/03/2020   Past Surgical History:  Procedure Laterality Date   TONSILLECTOMY     Social History   Tobacco Use   Smoking status: Former    Packs/day: 1    Types: Cigarettes   Smokeless tobacco: Never  Vaping Use   Vaping Use: Never used  Substance Use Topics   Alcohol use: No   Family History  Problem Relation Age of Onset   Depression Mother    Mental illness Mother    Prostate cancer Father    Alcohol abuse Brother    Depression Brother    Drug abuse Brother    Mental illness Brother    Cancer Maternal Grandmother        liver   Depression Maternal Grandmother     Alcohol abuse Maternal Grandfather    Depression Maternal Grandfather    Heart disease Maternal Grandfather    Hyperlipidemia Maternal Grandfather    Hypertension Maternal Grandfather    Heart disease Paternal Grandmother    Hyperlipidemia Paternal Grandmother    Hyperlipidemia Paternal Grandfather    Allergies  Allergen Reactions   Influenza Vaccines     Other reaction(s): Other Guillain-Barre   Tetanus Toxoids Other (See Comments)    Can only have the synthetic shot      Review of Systems  Constitutional:  Negative for chills and fever.  Respiratory:  Negative for shortness of breath.   Cardiovascular:  Negative for chest pain and palpitations.  Neurological:  Negative for dizziness and headaches.  Psychiatric/Behavioral:  Negative for suicidal ideas. The patient is nervous/anxious.       Objective:     BP 122/80   Pulse 82   Temp (!) 97.2 F (36.2 C) (Temporal)   Ht 6' (1.829 m)   Wt 214 lb (97.1 kg)   SpO2 95%   BMI 29.02 kg/m  BP Readings from Last 3 Encounters:  06/29/22 122/80  03/19/22 (!) 150/84  10/20/21 122/80   Wt Readings from Last 3 Encounters:  06/29/22 214 lb (97.1 kg)  03/19/22 207 lb (93.9 kg)  10/20/21 205 lb 2 oz (93 kg)      Physical Exam Vitals and nursing note reviewed.  Constitutional:      Appearance: Normal appearance.  Cardiovascular:     Rate and Rhythm: Normal rate and regular rhythm.     Pulses: Normal pulses.     Heart sounds: Normal heart sounds.  Pulmonary:     Effort: Pulmonary effort is normal.     Breath sounds: Normal breath sounds.  Skin:    General: Skin is warm.  Neurological:     Mental Status: He is alert and oriented to person, place, and time.  Psychiatric:        Mood and Affect: Mood normal.        Behavior: Behavior normal.        Thought Content: Thought content normal.      No results found for any visits on 06/29/22.     The 10-year ASCVD risk score (Arnett DK, et al., 2019) is:  8.6%    Assessment & Plan:   Problem List Items Addressed This Visit  Other   Anxiety and depression - Primary    Improved but not at goal.    Increase Cymbalta to 60 mg daily. He agrees.   He will update via MyChart in 4 weeks.   I evaluated patient, was consulted regarding treatment, and agree with assessment and plan per Bobby Gens, RN, DNP student.   Bobby Bossier, NP-C       Relevant Medications   DULoxetine (CYMBALTA) 60 MG capsule    No follow-ups on file.    Bobby Diaz, BSN-RN, DNP STUDENT

## 2022-08-31 DIAGNOSIS — Z1211 Encounter for screening for malignant neoplasm of colon: Secondary | ICD-10-CM

## 2022-08-31 NOTE — Addendum Note (Signed)
Addended by: Doreene Nest on: 08/31/2022 05:39 PM   Modules accepted: Orders

## 2022-09-01 ENCOUNTER — Telehealth: Payer: Self-pay

## 2022-09-01 NOTE — Telephone Encounter (Signed)
Message left for patient to return my call.  

## 2022-09-01 NOTE — Telephone Encounter (Signed)
Patient is calling to schedule his colonoscopy that his PCP referred him for.

## 2022-09-03 NOTE — Telephone Encounter (Signed)
Message left for patient to return my call.  

## 2022-09-08 ENCOUNTER — Encounter: Payer: Self-pay | Admitting: *Deleted

## 2022-10-01 ENCOUNTER — Other Ambulatory Visit: Payer: Self-pay | Admitting: Primary Care

## 2022-10-01 DIAGNOSIS — I10 Essential (primary) hypertension: Secondary | ICD-10-CM

## 2022-10-01 NOTE — Telephone Encounter (Signed)
Patient is due for CPE/follow up in late July, this will be required prior to any further refills.  Please schedule, thank you!   

## 2022-10-01 NOTE — Telephone Encounter (Signed)
LVM for patient to call back and schedule

## 2022-10-02 ENCOUNTER — Other Ambulatory Visit: Payer: Self-pay | Admitting: Primary Care

## 2022-10-02 DIAGNOSIS — F32A Depression, unspecified: Secondary | ICD-10-CM

## 2022-12-29 ENCOUNTER — Encounter: Payer: Self-pay | Admitting: Family Medicine

## 2022-12-29 ENCOUNTER — Ambulatory Visit (INDEPENDENT_AMBULATORY_CARE_PROVIDER_SITE_OTHER): Payer: 59 | Admitting: Family Medicine

## 2022-12-29 VITALS — BP 110/62 | HR 95 | Temp 98.1°F | Ht 72.0 in | Wt 199.0 lb

## 2022-12-29 DIAGNOSIS — R21 Rash and other nonspecific skin eruption: Secondary | ICD-10-CM | POA: Diagnosis not present

## 2022-12-29 DIAGNOSIS — R509 Fever, unspecified: Secondary | ICD-10-CM

## 2022-12-29 MED ORDER — DOXYCYCLINE HYCLATE 100 MG PO TABS: 100.0000 mg | ORAL_TABLET | Freq: Two times a day (BID) | ORAL | 0 refills | Status: DC

## 2022-12-29 NOTE — Assessment & Plan Note (Signed)
Acute, most likely etiology is cellulitis versus tickborne illness given appearance of rash on right abdomen. Will treat with doxycycline 100 mg p.o. twice daily x 10 days.  Prior to starting antibiotics will check tickborne illness antibody panel. Push fluids, rest.  Return and ER precautions provided

## 2022-12-29 NOTE — Progress Notes (Signed)
Patient ID: Bobby Diaz, male    DOB: 1971-02-02, 52 y.o.   MRN: [id number]  This visit was conducted in person.  BP 110/62 (BP Location: Left Arm, Patient Position: Sitting, Cuff Size: Normal)   Pulse 95   Temp 98.1 F (36.7 C) (Temporal)   Ht 6' (1.829 m)   Wt 199 lb (90.3 kg)   SpO2 96%   BMI 26.99 kg/m    CC:  Chief Complaint  Patient presents with   Insect Bite    Patient found bit on abdomen on Saturday; thinks it was a tick but never saw what bit him. Area is very red and is starting to get some more bumps on abdomen. Started with a fever and HA Sunday. Has been taking tylenol.    Subjective:   HPI: Bobby Diaz is a 52 y.o. male presenting on 12/29/2022 for Insect Bite (Patient found bit on abdomen on Saturday; thinks it was a tick but never saw what bit him. Area is very red and is starting to get some more bumps on abdomen. Started with a fever and HA Sunday. Has been taking tylenol.)  New onset rash on abdomen 6 days ago. Gradual worsening, spreading of rash on torso. With red bumps. Area sore.  New fever  101F and headache in last 4 days.  Chills.  Has had Flu A B and COVID test at home x 2 .. Negative.  Using tylenol for fever.   Outside all the time.   Low back ache, no joint pain, feeling weak.  No congestion, no cough, no ST, no ear pain, no face pain.   No urinary symptoms. No D/V.   Good water intake.    No history of skin issues.   Works in [employer] but usually on his own.  Not frequently in contact with public  Relevant past medical, surgical, family and social history reviewed and updated as indicated. Interim medical history since our last visit reviewed. Allergies and medications reviewed and updated. Outpatient Medications Prior to Visit  Medication Sig Dispense Refill   amLODipine-olmesartan (AZOR) 5-20 MG tablet TAKE 1 TABLET BY MOUTH EVERY DAY FOR BLOOD PRESSURE 90 tablet 0   DULoxetine (CYMBALTA) 60 MG capsule TAKE  1 CAPSULE (60 MG TOTAL) BY MOUTH DAILY. FOR ANXIETY AND DEPRESSION 90 capsule 0   ibuprofen (ADVIL,MOTRIN) 800 MG tablet Take 1 tablet (800 mg total) by mouth every 8 (eight) hours as needed for moderate pain. 15 tablet 0   rosuvastatin (CRESTOR) 5 MG tablet Take 1 tablet (5 mg total) by mouth daily. for cholesterol. 90 tablet 3   No facility-administered medications prior to visit.     Per HPI unless specifically indicated in ROS section below Review of Systems  Constitutional:  Positive for fatigue and fever.  HENT:  Negative for ear pain.   Eyes:  Negative for pain.  Respiratory:  Negative for cough and shortness of breath.   Cardiovascular:  Negative for chest pain, palpitations and leg swelling.  Gastrointestinal:  Negative for abdominal pain.  Genitourinary:  Negative for dysuria.  Musculoskeletal:  Negative for arthralgias, joint swelling, myalgias and neck stiffness.  Skin:  Positive for rash.  Neurological:  Positive for headaches. Negative for syncope and light-headedness.  Psychiatric/Behavioral:  Negative for dysphoric mood.    Objective:  BP 110/62 (BP Location: Left Arm, Patient Position: Sitting, Cuff Size: Normal)   Pulse 95   Temp 98.1 F (36.7 C) (Temporal)   Ht 6' (1.829  m)   Wt 199 lb (90.3 kg)   SpO2 96%   BMI 26.99 kg/m   Wt Readings from Last 3 Encounters:  12/29/22 199 lb (90.3 kg)  06/29/22 214 lb (97.1 kg)  03/19/22 207 lb (93.9 kg)      Physical Exam Constitutional:      Appearance: He is well-developed.  HENT:     Head: Normocephalic.     Right Ear: Hearing normal.     Left Ear: Hearing normal.     Nose: Nose normal.  Neck:     Thyroid: No thyroid mass or thyromegaly.     Vascular: No carotid bruit.     Trachea: Trachea normal.  Cardiovascular:     Rate and Rhythm: Normal rate and regular rhythm.     Pulses: Normal pulses.     Heart sounds: Heart sounds not distant. No murmur heard.    No friction rub. No gallop.     Comments: No  peripheral edema Pulmonary:     Effort: Pulmonary effort is normal. No respiratory distress.     Breath sounds: Normal breath sounds.  Skin:    General: Skin is warm and dry.     Findings: No rash.     Comments: See  photos  Psychiatric:        Speech: Speech normal.        Behavior: Behavior normal.        Thought Content: Thought content normal.          Results for orders placed or performed in visit on 10/20/21  Lipid panel  Result Value Ref Range   Cholesterol 158 0 - 200 mg/dL   Triglycerides 161.0 (H) 0.0 - 149.0 mg/dL   HDL 96.04 >54.09 mg/dL   VLDL 81.1 0.0 - 91.4 mg/dL   LDL Cholesterol 84 0 - 99 mg/dL   Total CHOL/HDL Ratio 4    NonHDL 115.40   Hemoglobin A1c  Result Value Ref Range   Hgb A1c MFr Bld 6.4 4.6 - 6.5 %    Assessment and Plan  Fever, unspecified fever cause Assessment & Plan: Acute, most likely etiology is cellulitis versus tickborne illness given appearance of rash on right abdomen. Will treat with doxycycline 100 mg p.o. twice daily x 10 days.  Prior to starting antibiotics will check tickborne illness antibody panel. Push fluids, rest.  Return and ER precautions provided  Orders: -     B. burgdorfi antibodies by WB -     Ehrlichia antibody panel -     Rocky mtn spotted fvr abs pnl(IgG+IgM)  Rash  Other orders -     Doxycycline Hyclate; Take 1 tablet (100 mg total) by mouth 2 (two) times daily.  Dispense: 20 tablet; Refill: 0    No follow-ups on file.   Kerby Nora, MD

## 2022-12-31 ENCOUNTER — Encounter: Payer: Self-pay | Admitting: Family Medicine

## 2023-01-02 LAB — EHRLICHIA ANTIBODY PANEL
E. CHAFFEENSIS AB IGG: <1:64 {titer}
E. CHAFFEENSIS AB IGM: <1:20 {titer}

## 2023-01-02 LAB — ROCKY MTN SPOTTED FVR ABS PNL(IGG+IGM)
RMSF IgG: NOT DETECTED
RMSF IgM: NOT DETECTED

## 2023-01-02 LAB — B. BURGDORFI ANTIBODIES BY WB
B burgdorferi IgG Abs (IB): NEGATIVE
B burgdorferi IgM Abs (IB): NEGATIVE
Lyme Disease 18 kD IgG: NONREACTIVE
Lyme Disease 23 kD IgG: NONREACTIVE
Lyme Disease 23 kD IgM: NONREACTIVE
Lyme Disease 28 kD IgG: NONREACTIVE
Lyme Disease 30 kD IgG: NONREACTIVE
Lyme Disease 39 kD IgG: NONREACTIVE
Lyme Disease 39 kD IgM: NONREACTIVE
Lyme Disease 41 kD IgG: NONREACTIVE
Lyme Disease 41 kD IgM: NONREACTIVE
Lyme Disease 45 kD IgG: NONREACTIVE
Lyme Disease 58 kD IgG: REACTIVE — AB
Lyme Disease 66 kD IgG: NONREACTIVE
Lyme Disease 93 kD IgG: REACTIVE — AB

## 2023-01-04 ENCOUNTER — Other Ambulatory Visit: Payer: Self-pay | Admitting: Primary Care

## 2023-01-04 DIAGNOSIS — F32A Depression, unspecified: Secondary | ICD-10-CM

## 2023-01-04 NOTE — Telephone Encounter (Signed)
Patient is due for CPE/follow up, this will be required prior to any further refills.  Please schedule, thank you!

## 2023-01-04 NOTE — Telephone Encounter (Signed)
Patient will call back to schedule once he gets off of work

## 2023-01-08 ENCOUNTER — Other Ambulatory Visit: Payer: Self-pay | Admitting: Primary Care

## 2023-01-08 DIAGNOSIS — I10 Essential (primary) hypertension: Secondary | ICD-10-CM

## 2023-01-09 NOTE — Telephone Encounter (Signed)
Patient is due for overdue CPE/follow up, this will be required prior to any further refills.  Please schedule, thank you!

## 2023-01-10 NOTE — Telephone Encounter (Signed)
LVM for patient to c/b and schedule.  

## 2023-02-12 ENCOUNTER — Other Ambulatory Visit: Payer: Self-pay | Admitting: Primary Care

## 2023-02-12 DIAGNOSIS — I10 Essential (primary) hypertension: Secondary | ICD-10-CM

## 2023-02-16 ENCOUNTER — Other Ambulatory Visit: Payer: Self-pay | Admitting: Primary Care

## 2023-02-16 DIAGNOSIS — I10 Essential (primary) hypertension: Secondary | ICD-10-CM

## 2023-03-08 ENCOUNTER — Encounter: Payer: 59 | Admitting: Primary Care

## 2023-04-05 ENCOUNTER — Encounter: Payer: Self-pay | Admitting: Primary Care

## 2023-04-05 ENCOUNTER — Ambulatory Visit: Payer: 59 | Admitting: Primary Care

## 2023-04-05 VITALS — BP 136/88 | HR 77 | Temp 97.6°F | Ht 72.0 in | Wt 211.0 lb

## 2023-04-05 DIAGNOSIS — Z8042 Family history of malignant neoplasm of prostate: Secondary | ICD-10-CM

## 2023-04-05 DIAGNOSIS — R5382 Chronic fatigue, unspecified: Secondary | ICD-10-CM | POA: Insufficient documentation

## 2023-04-05 DIAGNOSIS — G4733 Obstructive sleep apnea (adult) (pediatric): Secondary | ICD-10-CM | POA: Diagnosis not present

## 2023-04-05 DIAGNOSIS — I10 Essential (primary) hypertension: Secondary | ICD-10-CM

## 2023-04-05 DIAGNOSIS — E785 Hyperlipidemia, unspecified: Secondary | ICD-10-CM | POA: Diagnosis not present

## 2023-04-05 DIAGNOSIS — Z122 Encounter for screening for malignant neoplasm of respiratory organs: Secondary | ICD-10-CM

## 2023-04-05 DIAGNOSIS — Z0001 Encounter for general adult medical examination with abnormal findings: Secondary | ICD-10-CM | POA: Diagnosis not present

## 2023-04-05 DIAGNOSIS — Z1211 Encounter for screening for malignant neoplasm of colon: Secondary | ICD-10-CM

## 2023-04-05 DIAGNOSIS — F32A Depression, unspecified: Secondary | ICD-10-CM

## 2023-04-05 DIAGNOSIS — F419 Anxiety disorder, unspecified: Secondary | ICD-10-CM | POA: Diagnosis not present

## 2023-04-05 DIAGNOSIS — Z125 Encounter for screening for malignant neoplasm of prostate: Secondary | ICD-10-CM

## 2023-04-05 DIAGNOSIS — F17200 Nicotine dependence, unspecified, uncomplicated: Secondary | ICD-10-CM

## 2023-04-05 DIAGNOSIS — R7303 Prediabetes: Secondary | ICD-10-CM | POA: Diagnosis not present

## 2023-04-05 LAB — LIPID PANEL
Cholesterol: 206 mg/dL — ABNORMAL HIGH (ref 0–200)
HDL: 41.8 mg/dL (ref >39.00)
LDL Cholesterol: 125 mg/dL — ABNORMAL HIGH (ref 0–99)
NonHDL: 164.62
Total CHOL/HDL Ratio: 5
Triglycerides: 199 mg/dL — ABNORMAL HIGH (ref 0.0–149.0)
VLDL: 39.8 mg/dL (ref 0.0–40.0)

## 2023-04-05 LAB — HEMOGLOBIN A1C: Hgb A1c MFr Bld: 6 % (ref 4.6–6.5)

## 2023-04-05 LAB — COMPREHENSIVE METABOLIC PANEL
ALT: 20 U/L (ref 0–53)
AST: 16 U/L (ref 0–37)
Albumin: 4.3 g/dL (ref 3.5–5.2)
Alkaline Phosphatase: 70 U/L (ref 39–117)
BUN: 17 mg/dL (ref 6–23)
CO2: 27 meq/L (ref 19–32)
Calcium: 9 mg/dL (ref 8.4–10.5)
Chloride: 102 meq/L (ref 96–112)
Creatinine, Ser: 1.1 mg/dL (ref 0.40–1.50)
GFR: 76.19 mL/min (ref >60.00)
Glucose, Bld: 93 mg/dL (ref 70–99)
Potassium: 3.9 meq/L (ref 3.5–5.1)
Sodium: 137 meq/L (ref 135–145)
Total Bilirubin: 0.6 mg/dL (ref 0.2–1.2)
Total Protein: 6.5 g/dL (ref 6.0–8.3)

## 2023-04-05 LAB — TSH: TSH: 1.95 u[IU]/mL (ref 0.35–5.50)

## 2023-04-05 LAB — CBC
HCT: 43.5 % (ref 39.0–52.0)
Hemoglobin: 14.8 g/dL (ref 13.0–17.0)
MCHC: 34 g/dL (ref 30.0–36.0)
MCV: 89 fL (ref 78.0–100.0)
Platelets: 275 10*3/uL (ref 150.0–400.0)
RBC: 4.89 Mil/uL (ref 4.22–5.81)
RDW: 13.7 % (ref 11.5–15.5)
WBC: 5.5 10*3/uL (ref 4.0–10.5)

## 2023-04-05 LAB — VITAMIN D 25 HYDROXY (VIT D DEFICIENCY, FRACTURES): VITD: 30.44 ng/mL (ref 30.00–100.00)

## 2023-04-05 LAB — PSA: PSA: 3.66 ng/mL (ref 0.10–4.00)

## 2023-04-05 LAB — VITAMIN B12: Vitamin B-12: 194 pg/mL — ABNORMAL LOW (ref 211–911)

## 2023-04-05 MED ORDER — ROSUVASTATIN CALCIUM 5 MG PO TABS: 5.0000 mg | ORAL_TABLET | Freq: Every day | ORAL | 3 refills | Status: AC

## 2023-04-05 NOTE — Progress Notes (Signed)
Subjective:    Patient ID: Bobby Diaz, male    DOB: [birth year], 52 y.o.   MRN: [id number]  HPI  Bobby Diaz is a very pleasant 52 y.o. male who presents today for complete physical and follow up of chronic conditions.  He would also like to discuss ADHD symptoms. His wife is here today who helps to provide information for HPI. Symptoms include little impulse control, inability to focus on conversations as he's thinking about what he may say, difficulty completing tasks, starts and stops projects frequently. He doesn't feel that his anxiety is well controlled. He feels overwhelmed with bills and things that need to be done around the house. Previously managed by psychiatry when living in [place]. His depression has improved.   His wife is also concerned about continued fatigue. Improved with CPAP use. He works alternating day and night shift, works 7 days and 7 nights per month, not consecutive. He will often flip back and forth. His wife questions if he has a vitamin deficiency or another problem. He denies fatigue while at work. He does like to nap during the day, especially if he's not doing much. He has been off on vacation for the last 12 days and hasn't noticed improvement in fatigue  Immunizations: -Tetanus: Declines  -Influenza: Declines today -Shingles: Completed Shingrix series  Diet: Fair diet.  Exercise: Walking at work, active at home.   Eye exam: Completes annually  Dental exam: Completes once annually   Colonoscopy: Never completed last year.  Lung Cancer Screening: Never completed   PSA: Due  BP Readings from Last 3 Encounters:  04/05/23 136/88  12/29/22 110/62  06/29/22 122/80    He is checking his BP at home which is running 120/80.      Review of Systems  Constitutional:  Positive for fatigue. Negative for unexpected weight change.  HENT:  Negative for rhinorrhea.   Respiratory:  Negative for cough and shortness of breath.    Cardiovascular:  Negative for chest pain.  Gastrointestinal:  Negative for constipation and diarrhea.  Genitourinary:  Negative for difficulty urinating.  Musculoskeletal:  Negative for arthralgias and myalgias.  Skin:  Negative for rash.  Allergic/Immunologic: Negative for environmental allergies.  Neurological:  Negative for dizziness, numbness and headaches.  Psychiatric/Behavioral:  The patient is nervous/anxious.          Past Medical History:  Diagnosis Date   Alcohol abuse    Chickenpox    Depression    GERD (gastroesophageal reflux disease)    Snoring 06/03/2020    Social History   Socioeconomic History   Marital status: Married    Spouse name: Not on file   Number of children: Not on file   Years of education: Not on file   Highest education level: Not on file  Occupational History   Not on file  Tobacco Use   Smoking status: Former    Current packs/day: 1.00    Types: Cigarettes   Smokeless tobacco: Never  Vaping Use   Vaping status: Never Used  Substance and Sexual Activity   Alcohol use: No   Drug use: Not on file   Sexual activity: Not on file  Other Topics Concern   Not on file  Social History Narrative   Married.   4 children.   Works in [employer].   Enjoys fishing, being outdoors, golfing.   Social Drivers of Corporate investment banker Strain: Not on file  Food Insecurity: Not on file  Transportation Needs: Not on file  Physical Activity: Not on file  Stress: Not on file  Social Connections: Not on file  Intimate Partner Violence: Not on file    Past Surgical History:  Procedure Laterality Date   TONSILLECTOMY      Family History  Problem Relation Age of Onset   Depression Mother    Mental illness Mother    Prostate cancer Father    Alcohol abuse Brother    Depression Brother    Drug abuse Brother    Mental illness Brother    Cancer Maternal Grandmother        liver   Depression Maternal Grandmother    Alcohol  abuse Maternal Grandfather    Depression Maternal Grandfather    Heart disease Maternal Grandfather    Hyperlipidemia Maternal Grandfather    Hypertension Maternal Grandfather    Heart disease Paternal Grandmother    Hyperlipidemia Paternal Grandmother    Hyperlipidemia Paternal Grandfather     Allergies  Allergen Reactions   Influenza Vaccines     Other reaction(s): Other Guillain-Barre   Tetanus Toxoids Other (See Comments)    Can only have the synthetic shot    Current Outpatient Medications on File Prior to Visit  Medication Sig Dispense Refill   amLODipine-olmesartan (AZOR) 5-20 MG tablet TAKE 1 TABLET BY MOUTH EVERY DAY FOR BLOOD PRESSURE 30 tablet 0   DULoxetine (CYMBALTA) 60 MG capsule TAKE 1 CAPSULE (60 MG TOTAL) BY MOUTH DAILY. FOR ANXIETY AND DEPRESSION 90 capsule 0   ibuprofen (ADVIL,MOTRIN) 800 MG tablet Take 1 tablet (800 mg total) by mouth every 8 (eight) hours as needed for moderate pain. 15 tablet 0   No current facility-administered medications on file prior to visit.    BP 136/88 (BP Location: Left Arm, Patient Position: Sitting, Cuff Size: Normal)   Pulse 77   Temp 97.6 F (36.4 C) (Temporal)   Ht 6' (1.829 m)   Wt 211 lb (95.7 kg)   SpO2 97%   BMI 28.62 kg/m  Objective:   Physical Exam HENT:     Right Ear: Tympanic membrane and ear canal normal.     Left Ear: Tympanic membrane and ear canal normal.  Eyes:     Pupils: Pupils are equal, round, and reactive to light.  Cardiovascular:     Rate and Rhythm: Normal rate and regular rhythm.  Pulmonary:     Effort: Pulmonary effort is normal.     Breath sounds: Normal breath sounds.  Abdominal:     General: Bowel sounds are normal.     Palpations: Abdomen is soft.     Tenderness: There is no abdominal tenderness.  Musculoskeletal:        General: Normal range of motion.     Cervical back: Neck supple.  Skin:    General: Skin is warm and dry.  Neurological:     Mental Status: He is alert and  oriented to person, place, and time.     Cranial Nerves: No cranial nerve deficit.     Deep Tendon Reflexes:     Reflex Scores:      Patellar reflexes are 2+ on the right side and 2+ on the left side. Psychiatric:        Mood and Affect: Mood normal.           Assessment & Plan:  Encounter for annual general medical examination with abnormal findings in adult Assessment & Plan: Declines tetanus and influenza vaccines given history of Guillian  Barr. Colonoscopy overdue, he agrees to complete. Referral placed for lung cancer screening program. PSA due and pending.  Discussed the importance of a healthy diet and regular exercise in order for weight loss, and to reduce the risk of further co-morbidity.  Exam stable. Labs pending.  Follow up in 1 year for repeat physical.    Anxiety and depression Assessment & Plan: Depression controlled, anxiety seems uncontrolled. Also with some potential ADHD symptoms.  Given that he has failed several treatments in the past, we agreed to refer to psychiatry for further treatment and evaluation. Referral placed to psychiatry.  Continue Cymbalta 60 mg daily.  Orders: -     Ambulatory referral to Psychiatry  Screening for lung cancer -     Ambulatory Referral for Lung Cancer Scre  Screening for colon cancer -     Ambulatory referral to Gastroenterology  OSA (obstructive sleep apnea) Assessment & Plan: Continue CPAP nightly.    Essential hypertension Assessment & Plan: Controlled.  Continue amlodipine-olmesartan 5-20 mg daily. CMP pending.  Orders: -     Comprehensive metabolic panel -     CBC  Family history of prostate cancer in father Assessment & Plan: Repeat PSA pending.   Hyperlipidemia, unspecified hyperlipidemia type Assessment & Plan: He has been out of rosuvastatin for several months.  Refills provided. Start rosuvastatin 5 mg daily. Repeat lipid panel pending.  Orders: -     Lipid panel -      Rosuvastatin Calcium; Take 1 tablet (5 mg total) by mouth daily. for cholesterol.  Dispense: 90 tablet; Refill: 3  Nicotine dependence with current use Assessment & Plan: Commended on cutting back. Referral placed for lung cancer screening program.   Prediabetes Assessment & Plan: Repeat A1c pending.  Orders: -     Hemoglobin A1c  Chronic fatigue Assessment & Plan: Differentials include switching day/night shift frequently, metabolic cause, anxiety.  Continue CPAP nightly. Labs pending today. Referral placed to psychiatry for treatment of anxiety.  Orders: -     VITAMIN D 25 Hydroxy (Vit-D Deficiency, Fractures) -     Vitamin B12 -     TSH  Screening for prostate cancer -     PSA        Doreene Nest, NP

## 2023-04-05 NOTE — Patient Instructions (Signed)
Stop by the lab prior to leaving today. I will notify you of your results once received.   You will either be contacted via phone regarding your referral to the lung cancer screening program, GI for the colonoscopy, and psychiatry, or you may receive a letter on your MyChart portal from our referral team with instructions for scheduling an appointment. Please let us know if you have not been contacted by anyone within two weeks.  It was a pleasure to see you today!

## 2023-04-05 NOTE — Assessment & Plan Note (Signed)
Repeat PSA pending.

## 2023-04-05 NOTE — Assessment & Plan Note (Signed)
Declines tetanus and influenza vaccines given history of Guillian Barr. Colonoscopy overdue, he agrees to complete. Referral placed for lung cancer screening program. PSA due and pending.  Discussed the importance of a healthy diet and regular exercise in order for weight loss, and to reduce the risk of further co-morbidity.  Exam stable. Labs pending.  Follow up in 1 year for repeat physical.

## 2023-04-05 NOTE — Assessment & Plan Note (Signed)
Continue CPAP nightly. °

## 2023-04-05 NOTE — Assessment & Plan Note (Addendum)
He has been out of rosuvastatin for several months.  Refills provided. Start rosuvastatin 5 mg daily. Repeat lipid panel pending.

## 2023-04-05 NOTE — Assessment & Plan Note (Signed)
Repeat A1c pending. 

## 2023-04-05 NOTE — Assessment & Plan Note (Addendum)
Controlled.  Continue amlodipine-olmesartan 5-20 mg daily. CMP pending.

## 2023-04-05 NOTE — Assessment & Plan Note (Signed)
Differentials include switching day/night shift frequently, metabolic cause, anxiety.  Continue CPAP nightly. Labs pending today. Referral placed to psychiatry for treatment of anxiety.

## 2023-04-05 NOTE — Assessment & Plan Note (Signed)
Depression controlled, anxiety seems uncontrolled. Also with some potential ADHD symptoms.  Given that he has failed several treatments in the past, we agreed to refer to psychiatry for further treatment and evaluation. Referral placed to psychiatry.  Continue Cymbalta 60 mg daily.

## 2023-04-05 NOTE — Assessment & Plan Note (Signed)
Commended on cutting back. Referral placed for lung cancer screening program.

## 2023-04-18 ENCOUNTER — Other Ambulatory Visit: Payer: Self-pay | Admitting: Primary Care

## 2023-04-18 DIAGNOSIS — F32A Depression, unspecified: Secondary | ICD-10-CM

## 2023-04-28 ENCOUNTER — Encounter: Payer: Self-pay | Admitting: *Deleted

## 2023-07-24 ENCOUNTER — Other Ambulatory Visit: Payer: Self-pay | Admitting: Primary Care

## 2023-07-24 DIAGNOSIS — F32A Depression, unspecified: Secondary | ICD-10-CM

## 2023-07-24 DIAGNOSIS — F419 Anxiety disorder, unspecified: Secondary | ICD-10-CM
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
## 2023-08-08 ENCOUNTER — Encounter: Payer: Self-pay | Admitting: *Deleted

## 2023-08-11 ENCOUNTER — Other Ambulatory Visit: Payer: Self-pay

## 2023-08-11 ENCOUNTER — Encounter: Payer: Self-pay | Admitting: Psychiatry

## 2023-08-11 ENCOUNTER — Ambulatory Visit: Admitting: Psychiatry

## 2023-08-11 VITALS — BP 132/89 | HR 98 | Temp 97.8°F | Ht 72.0 in | Wt 213.4 lb

## 2023-08-11 DIAGNOSIS — F331 Major depressive disorder, recurrent, moderate: Secondary | ICD-10-CM

## 2023-08-11 DIAGNOSIS — F4310 Post-traumatic stress disorder, unspecified: Secondary | ICD-10-CM

## 2023-08-11 DIAGNOSIS — F1021 Alcohol dependence, in remission: Secondary | ICD-10-CM

## 2023-08-11 DIAGNOSIS — G4700 Insomnia, unspecified: Secondary | ICD-10-CM | POA: Diagnosis not present

## 2023-08-11 MED ORDER — PRAZOSIN HCL 1 MG PO CAPS: 1.0000 mg | ORAL_CAPSULE | Freq: Every day | ORAL | 0 refills | Status: DC

## 2023-08-11 MED ORDER — PRAZOSIN HCL 2 MG PO CAPS: 2.0000 mg | ORAL_CAPSULE | Freq: Every day | ORAL | 1 refills | Status: DC

## 2023-08-11 MED ORDER — DULOXETINE HCL 30 MG PO CPEP: 30.0000 mg | ORAL_CAPSULE | Freq: Every day | ORAL | 1 refills | Status: DC

## 2023-08-11 NOTE — Patient Instructions (Addendum)
 Increase duloxetine  90 mg daily  Start melatonin 3 mg, 2 hours before going to bed If the above is not effective for sleep, initiate prazosin  1 mg at bedtime for 3 nights, then increase to 2 mg at bedtime. Referral to therapy  Next appointment: 6/19 at 3:30

## 2023-09-03 ENCOUNTER — Other Ambulatory Visit: Payer: Self-pay | Admitting: Psychiatry
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
## 2023-09-25 NOTE — Progress Notes (Addendum)
 This encounter was created in error - please disregard.

## 2023-09-29 ENCOUNTER — Ambulatory Visit (INDEPENDENT_AMBULATORY_CARE_PROVIDER_SITE_OTHER): Admitting: Psychiatry

## 2023-09-29 ENCOUNTER — Other Ambulatory Visit: Payer: Self-pay

## 2023-09-29 ENCOUNTER — Encounter: Payer: Self-pay | Admitting: Psychiatry

## 2023-09-29 VITALS — BP 145/96 | HR 83 | Temp 96.8°F | Ht 72.0 in | Wt 207.4 lb

## 2023-09-29 DIAGNOSIS — F331 Major depressive disorder, recurrent, moderate: Secondary | ICD-10-CM | POA: Diagnosis not present

## 2023-09-29 DIAGNOSIS — F1021 Alcohol dependence, in remission: Secondary | ICD-10-CM

## 2023-09-29 DIAGNOSIS — F4310 Post-traumatic stress disorder, unspecified: Secondary | ICD-10-CM | POA: Diagnosis not present

## 2023-09-29 DIAGNOSIS — G4700 Insomnia, unspecified: Secondary | ICD-10-CM | POA: Diagnosis not present

## 2023-09-29 MED ORDER — DULOXETINE HCL 30 MG PO CPEP: 30.0000 mg | ORAL_CAPSULE | Freq: Every day | ORAL | 1 refills | Status: DC

## 2023-09-29 NOTE — Patient Instructions (Signed)
 Continue duloxetine  90 mg daily  Start prazosin  1 mg at bedtime for 3 nights, then increase to 2 mg at bedtime. Continue melatonin 3 mg, 2 hours before going to bed Next appointment: 7/31 at 8 AM

## 2023-10-17 NOTE — Addendum Note (Signed)
 Addended by: Tymon Nemetz on: 10/17/2023 04:33 PM   Modules accepted: Orders, Level of Service

## 2023-11-03 ENCOUNTER — Other Ambulatory Visit: Payer: Self-pay | Admitting: Psychiatry

## 2023-11-05 ENCOUNTER — Other Ambulatory Visit: Payer: Self-pay | Admitting: Psychiatry
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
## 2023-11-10 ENCOUNTER — Encounter: Payer: Self-pay | Admitting: Psychiatry

## 2023-11-10 ENCOUNTER — Ambulatory Visit (INDEPENDENT_AMBULATORY_CARE_PROVIDER_SITE_OTHER): Admitting: Psychiatry

## 2023-11-10 VITALS — BP 138/98 | HR 86 | Temp 98.6°F | Ht 72.0 in | Wt 206.4 lb

## 2023-11-10 DIAGNOSIS — F331 Major depressive disorder, recurrent, moderate: Secondary | ICD-10-CM | POA: Diagnosis not present

## 2023-11-10 DIAGNOSIS — F4310 Post-traumatic stress disorder, unspecified: Secondary | ICD-10-CM | POA: Diagnosis not present

## 2023-11-10 MED ORDER — DULOXETINE HCL 30 MG PO CPEP: 30.0000 mg | ORAL_CAPSULE | Freq: Every day | ORAL | 0 refills | Status: DC

## 2023-11-10 MED ORDER — CLONIDINE HCL 0.1 MG PO TABS: 0.1000 mg | ORAL_TABLET | Freq: Every day | ORAL | 1 refills | Status: DC

## 2023-11-10 NOTE — Patient Instructions (Signed)
 Continue duloxetine  90 mg daily  Hold prazosin  Continue melatonin 3 mg, 2 hours before going to bed Start clonidine  0.1.mg at night Next appointment: 9/16 at 8:30

## 2023-11-16 ENCOUNTER — Other Ambulatory Visit: Payer: Self-pay | Admitting: Psychiatry

## 2023-11-16 MED ORDER — DULOXETINE HCL 30 MG PO CPEP: 30.0000 mg | ORAL_CAPSULE | Freq: Every day | ORAL | 0 refills | Status: DC

## 2023-12-02 ENCOUNTER — Telehealth: Payer: Self-pay

## 2023-12-02 NOTE — Telephone Encounter (Signed)
 Copied from CRM #[id number]. Topic: General - Other >> Dec 02, 2023 12:45 PM Burnard DEL wrote: Reason for CRM: [facility] called to check on the status of office receiving order for cpap request.  Hey would like to know if office received order.They will be refaxing order as well. Contact #: [phone]

## 2023-12-02 NOTE — Telephone Encounter (Signed)
 We have not received this fax as of now, I will keep a lookout for it.

## 2023-12-04 ENCOUNTER — Other Ambulatory Visit: Payer: Self-pay | Admitting: Psychiatry

## 2023-12-06 NOTE — Telephone Encounter (Signed)
 Received order requesting CPAP supplies.  Patient last saw Pulmonology in May 2022. Does this order need to be sent to Pulmonology?

## 2023-12-07 NOTE — Telephone Encounter (Signed)
 Yes, patient should be following up with pulmonology.

## 2023-12-08 NOTE — Telephone Encounter (Signed)
 Order faxed to pulmonology

## 2023-12-19 ENCOUNTER — Encounter: Payer: Self-pay | Admitting: *Deleted
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
## 2023-12-27 ENCOUNTER — Ambulatory Visit: Admitting: Psychiatry

## 2024-02-04 ENCOUNTER — Other Ambulatory Visit: Payer: Self-pay | Admitting: Primary Care

## 2024-02-04 DIAGNOSIS — F419 Anxiety disorder, unspecified: Secondary | ICD-10-CM

## 2024-02-05 NOTE — Telephone Encounter (Signed)
 Patient is due for CPE/follow up after April 04, 2024, this will be required prior to any further refills.  Please schedule, thank you!

## 2024-02-06 NOTE — Telephone Encounter (Signed)
 Lvm/sent Mychart

## 2024-02-08 ENCOUNTER — Emergency Department
Admission: EM | Admit: 2024-02-08 | Discharge: 2024-02-08 | Disposition: A | Discharge status: Home / Self Care | Attending: Emergency Medicine | Admitting: Emergency Medicine

## 2024-02-08 ENCOUNTER — Other Ambulatory Visit: Payer: Self-pay

## 2024-02-08 ENCOUNTER — Emergency Department

## 2024-02-08 DIAGNOSIS — I10 Essential (primary) hypertension: Secondary | ICD-10-CM | POA: Diagnosis not present

## 2024-02-08 DIAGNOSIS — S61223A Laceration with foreign body of left middle finger without damage to nail, initial encounter: Secondary | ICD-10-CM | POA: Insufficient documentation

## 2024-02-08 DIAGNOSIS — S6992XA Unspecified injury of left wrist, hand and finger(s), initial encounter: Secondary | ICD-10-CM | POA: Diagnosis present

## 2024-02-08 DIAGNOSIS — W268XXA Contact with other sharp object(s), not elsewhere classified, initial encounter: Secondary | ICD-10-CM | POA: Insufficient documentation

## 2024-02-08 MED ORDER — LIDOCAINE HCL (PF) 1 % IJ SOLN: 5.0000 mL | Freq: Once | INTRAMUSCULAR | Status: DC

## 2024-02-08 MED ORDER — LIDOCAINE HCL (PF) 1 % IJ SOLN
5.0000 mL | Freq: Once | INTRAMUSCULAR | Status: AC
  Administered 2024-02-08: 5 mL via INTRADERMAL
  Filled 2024-02-08: qty 5

## 2024-02-08 MED ORDER — LIDOCAINE-EPINEPHRINE (PF) 1 %-1:200000 IJ SOLN
10.0000 mL | Freq: Once | INTRAMUSCULAR | Status: AC
  Administered 2024-02-08: 10 mL
  Filled 2024-02-08: qty 30

## 2024-02-08 MED ORDER — CEPHALEXIN 500 MG PO CAPS: 500.0000 mg | ORAL_CAPSULE | Freq: Three times a day (TID) | ORAL | 0 refills | Status: AC

## 2024-02-08 MED ORDER — BACITRACIN ZINC 500 UNIT/GM EX OINT
TOPICAL_OINTMENT | Freq: Once | CUTANEOUS | Status: AC
Start: 2024-02-08 — End: 2024-02-08
  Administered 2024-02-08: 1 via TOPICAL
  Filled 2024-02-08: qty 0.9

## 2024-02-08 NOTE — ED Notes (Signed)
 Bandage placed and ointment over site. Pt instructed how to care for wound. Pt verbalized understanding.

## 2024-02-08 NOTE — ED Triage Notes (Signed)
 Pt sts that he was unloading the dishwasher and slipped and a coffee cup was falling and cut his middle finger down to the bone. Pt has pressure wound on finger at this time.

## 2024-02-08 NOTE — ED Provider Notes (Signed)
   [facility] Provider Note    Event Date/Time   First MD Initiated Contact with Patient 02/08/24 1351     (approximate)   History   Laceration   HPI  Bobby Diaz is a 53 y.o. male with history of GERD, depression, hyperlipidemia, kidney stone, hypertension and as listed in EMR presents to the emergency department for treatment and evaluation of laceration to his left hand at the base of the middle finger.  He tripped while unloading the dishwasher and landed on a coffee cup.     Physical Exam    Vitals:   02/08/24 1337  BP: (!) 180/108  Pulse: 74  Resp: 17  Temp: 98.4 F (36.9 C)  SpO2: 100%    General: Awake, no distress. *** CV:  Good peripheral perfusion. *** Resp:  Normal effort. *** Abd:  No distention. *** Other:  ***   ED Results / Procedures / Treatments   Labs (all labs ordered are listed, but only abnormal results are displayed)  Labs Reviewed - No data to display   EKG  ***   RADIOLOGY  Image and radiology report reviewed and interpreted by me. Radiology report consistent with the same.  ***  PROCEDURES:  Critical Care performed: {CriticalCareYesNo:19197::Yes, see critical care procedure note(s),No}  Procedures   MEDICATIONS ORDERED IN ED:  Medications - No data to display   IMPRESSION / MDM / ASSESSMENT AND PLAN / ED COURSE   I have reviewed the triage note and vital signs. Vital signs ***   Differential diagnosis includes, but is not limited to, ***  Patient's presentation is most consistent with {EM COPA:27473}  {**The patient is on the cardiac monitor to evaluate for evidence of arrhythmia and/or significant heart rate changes.**}      FINAL CLINICAL IMPRESSION(S) / ED DIAGNOSES   Final diagnoses:  None     Rx / DC Orders   ED Discharge Orders     None        Note:  This document was prepared using Dragon voice recognition software and may include unintentional  dictation errors.

## 2024-02-08 NOTE — Discharge Instructions (Signed)
Do not get the sutured area wet for 24 hours. After 24 hours, shower/bathe as usual and pat the area dry. °Change the bandage 2 times per day and apply antibiotic ointment. °Leave open to air when at no risk of getting the area dirty, but cover at night before bed. °See your PCP or go to Urgent Care in 10 days for suture removal or sooner for signs or concern of infection. ° °

## 2024-02-10 ENCOUNTER — Other Ambulatory Visit: Payer: Self-pay | Admitting: Psychiatry

## 2024-02-10 NOTE — Telephone Encounter (Signed)
 Could you contact to make a follow up if interested? Video is fine if it works better.

## 2024-02-20 ENCOUNTER — Other Ambulatory Visit: Payer: Self-pay | Admitting: Primary Care

## 2024-02-20 ENCOUNTER — Other Ambulatory Visit: Payer: Self-pay | Admitting: Psychiatry

## 2024-02-20 DIAGNOSIS — F32A Depression, unspecified: Secondary | ICD-10-CM

## 2024-02-20 NOTE — Telephone Encounter (Signed)
 Called the pharmacy spoke to [place] and he stated that the patients Duloxetine  30 mg needs a new prescription for a 90 day supply please advise

## 2024-03-06 ENCOUNTER — Other Ambulatory Visit: Payer: Self-pay | Admitting: Psychiatry

## 2024-03-26 ENCOUNTER — Other Ambulatory Visit: Payer: Self-pay | Admitting: Psychiatry

## 2024-03-26 MED ORDER — CLONIDINE HCL 0.1 MG PO TABS: 0.1000 mg | ORAL_TABLET | Freq: Every day | ORAL | 0 refills | Status: DC

## 2024-04-06 ENCOUNTER — Encounter: Admitting: Primary Care

## 2024-04-17 ENCOUNTER — Other Ambulatory Visit: Payer: Self-pay | Admitting: Psychiatry

## 2024-04-26 ENCOUNTER — Other Ambulatory Visit: Payer: Self-pay | Admitting: Psychiatry

## 2024-04-26 MED ORDER — CLONIDINE HCL 0.1 MG PO TABS: 0.1000 mg | ORAL_TABLET | Freq: Every day | ORAL | 0 refills | Status: AC
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
## 2024-04-30 ENCOUNTER — Other Ambulatory Visit: Payer: Self-pay | Admitting: Primary Care

## 2024-04-30 DIAGNOSIS — F419 Anxiety disorder, unspecified: Secondary | ICD-10-CM

## 2024-04-30 NOTE — Telephone Encounter (Signed)
Patient is overdue for CPE/follow up, this will be required prior to any further refills.  Please schedule, thank you!   

## 2024-05-02 ENCOUNTER — Encounter: Payer: Self-pay | Admitting: Psychiatry

## 2024-05-02 ENCOUNTER — Telehealth: Admitting: Psychiatry

## 2024-05-02 DIAGNOSIS — F3341 Major depressive disorder, recurrent, in partial remission: Secondary | ICD-10-CM | POA: Diagnosis not present

## 2024-05-02 DIAGNOSIS — F4310 Post-traumatic stress disorder, unspecified: Secondary | ICD-10-CM | POA: Diagnosis not present

## 2024-05-02 MED ORDER — DULOXETINE HCL 60 MG PO CPEP: 60.0000 mg | ORAL_CAPSULE | Freq: Every day | ORAL | 1 refills | Status: AC

## 2024-05-02 MED ORDER — DULOXETINE HCL 30 MG PO CPEP: 30.0000 mg | ORAL_CAPSULE | Freq: Every day | ORAL | 1 refills | Status: AC

## 2024-05-04 ENCOUNTER — Other Ambulatory Visit: Payer: Self-pay | Admitting: Primary Care

## 2024-05-04 DIAGNOSIS — E785 Hyperlipidemia, unspecified: Secondary | ICD-10-CM

## 2024-05-14 DIAGNOSIS — I10 Essential (primary) hypertension: Secondary | ICD-10-CM

## 2024-05-17 MED ORDER — AMLODIPINE-OLMESARTAN 5-20 MG PO TABS: 1.0000 | ORAL_TABLET | Freq: Every day | ORAL | 0 refills | Status: AC

## 2024-06-18 ENCOUNTER — Telehealth: Admitting: Psychiatry
